# Patient Record
Sex: Female | Born: 1975 | Race: White | Hispanic: No | Marital: Married | Smoking: Never smoker
Health system: Southern US, Community
[De-identification: ages and names within clinical notes are randomized; demographics above are authoritative.]

## PROBLEM LIST (undated history)

## (undated) DIAGNOSIS — E079 Disorder of thyroid, unspecified: Secondary | ICD-10-CM

## (undated) DIAGNOSIS — Z90710 Acquired absence of both cervix and uterus: Secondary | ICD-10-CM

## (undated) DIAGNOSIS — T4145XA Adverse effect of unspecified anesthetic, initial encounter: Secondary | ICD-10-CM

## (undated) DIAGNOSIS — N938 Other specified abnormal uterine and vaginal bleeding: Secondary | ICD-10-CM

## (undated) DIAGNOSIS — T8859XA Other complications of anesthesia, initial encounter: Secondary | ICD-10-CM

## (undated) DIAGNOSIS — N946 Dysmenorrhea, unspecified: Secondary | ICD-10-CM

## (undated) DIAGNOSIS — Z789 Other specified health status: Secondary | ICD-10-CM

## (undated) HISTORY — PX: OTHER SURGICAL HISTORY: SHX169

---

## 1999-01-10 ENCOUNTER — Ambulatory Visit (HOSPITAL_COMMUNITY): Admission: RE | Admit: 1999-01-10 | Discharge: 1999-01-10 | Payer: Self-pay | Admitting: Family Medicine

## 1999-01-10 ENCOUNTER — Encounter: Payer: Self-pay | Admitting: Family Medicine

## 2003-01-17 ENCOUNTER — Other Ambulatory Visit: Admission: RE | Admit: 2003-01-17 | Discharge: 2003-01-17 | Payer: Self-pay | Admitting: Obstetrics & Gynecology

## 2003-06-26 ENCOUNTER — Encounter: Payer: Self-pay | Admitting: Emergency Medicine

## 2003-06-26 ENCOUNTER — Emergency Department (HOSPITAL_COMMUNITY): Admission: EM | Admit: 2003-06-26 | Discharge: 2003-06-26 | Payer: Self-pay | Admitting: Emergency Medicine

## 2004-01-18 ENCOUNTER — Other Ambulatory Visit: Admission: RE | Admit: 2004-01-18 | Discharge: 2004-01-18 | Payer: Self-pay | Admitting: Obstetrics & Gynecology

## 2004-01-26 ENCOUNTER — Ambulatory Visit (HOSPITAL_COMMUNITY): Admission: RE | Admit: 2004-01-26 | Discharge: 2004-01-26 | Payer: Self-pay | Admitting: Obstetrics & Gynecology

## 2004-05-30 ENCOUNTER — Encounter: Admission: RE | Admit: 2004-05-30 | Discharge: 2004-05-30 | Payer: Self-pay | Admitting: Chiropractic Medicine

## 2005-06-04 ENCOUNTER — Other Ambulatory Visit: Admission: RE | Admit: 2005-06-04 | Discharge: 2005-06-04 | Payer: Self-pay | Admitting: Obstetrics and Gynecology

## 2005-12-05 ENCOUNTER — Inpatient Hospital Stay (HOSPITAL_COMMUNITY): Admission: AD | Admit: 2005-12-05 | Discharge: 2005-12-08 | Payer: Self-pay | Admitting: Obstetrics and Gynecology

## 2008-04-06 ENCOUNTER — Ambulatory Visit: Payer: Self-pay | Admitting: Internal Medicine

## 2008-04-06 LAB — CONVERTED CEMR LAB
Basophils Absolute: 0 10*3/uL (ref 0.0–0.1)
Basophils Relative: 0.2 % (ref 0.0–3.0)
Eosinophils Absolute: 0.1 10*3/uL (ref 0.0–0.7)
Eosinophils Relative: 0.6 % (ref 0.0–5.0)
HCT: 31.8 % — ABNORMAL LOW (ref 36.0–46.0)
Hemoglobin: 10.7 g/dL — ABNORMAL LOW (ref 12.0–15.0)
Lymphocytes Relative: 14.4 % (ref 12.0–46.0)
MCHC: 33.6 g/dL (ref 30.0–36.0)
MCV: 80.5 fL (ref 78.0–100.0)
Monocytes Absolute: 0.7 10*3/uL (ref 0.1–1.0)
Monocytes Relative: 6.8 % (ref 3.0–12.0)
Neutro Abs: 8.1 10*3/uL — ABNORMAL HIGH (ref 1.4–7.7)
Neutrophils Relative %: 78 % — ABNORMAL HIGH (ref 43.0–77.0)
Platelets: 166 10*3/uL (ref 150–400)
RBC: 3.95 M/uL (ref 3.87–5.11)
RDW: 15.3 % — ABNORMAL HIGH (ref 11.5–14.6)
TSH: 1.32 microintl units/mL (ref 0.35–5.50)
WBC: 10.4 10*3/uL (ref 4.5–10.5)

## 2008-04-07 ENCOUNTER — Encounter: Payer: Self-pay | Admitting: Internal Medicine

## 2008-04-07 ENCOUNTER — Ambulatory Visit: Payer: Self-pay

## 2008-05-14 ENCOUNTER — Inpatient Hospital Stay (HOSPITAL_COMMUNITY): Admission: AD | Admit: 2008-05-14 | Discharge: 2008-05-14 | Payer: Self-pay | Admitting: Obstetrics and Gynecology

## 2008-05-15 ENCOUNTER — Inpatient Hospital Stay (HOSPITAL_COMMUNITY): Admission: AD | Admit: 2008-05-15 | Discharge: 2008-05-18 | Payer: Self-pay | Admitting: Obstetrics and Gynecology

## 2008-05-22 ENCOUNTER — Inpatient Hospital Stay (HOSPITAL_COMMUNITY): Admission: AD | Admit: 2008-05-22 | Discharge: 2008-05-22 | Payer: Self-pay | Admitting: Obstetrics and Gynecology

## 2009-08-30 ENCOUNTER — Emergency Department (HOSPITAL_BASED_OUTPATIENT_CLINIC_OR_DEPARTMENT_OTHER): Admission: EM | Admit: 2009-08-30 | Discharge: 2009-08-30 | Payer: Self-pay | Admitting: Emergency Medicine

## 2011-02-04 NOTE — Assessment & Plan Note (Signed)
[facility]                            CARDIOLOGY OFFICE NOTE   Kerri Patterson, Kerri Patterson                         MRN:          [id number]  DATE:04/06/2008                            DOB:          1975/10/18    IDENTIFICATION:  The patient is a 35 year old who is referred for  evaluation of shortness of breath and palpitations.   HISTORY OF PRESENT ILLNESS:  The patient is currently in her 33rd week  of pregnancy.  She states for the past 3-1/2 months, she has been short  of breath, which has been progressive.  Today, she is bit short of  breath walking from the waiting room.  She feels her heart racing with  this.  It takes a while to come down after.  She states walking up the  stairs, she gets short of breath.  If she takes a shower and if she gets  into a car, she is short of breath.   The patient is currently in her second pregnancy.  The first pregnancy  was without any problems, normal spontaneous vaginal delivery.  After  this pregnancy, she did fine.  Again, she only noticed these things  about 3-1/2 months ago.   The patient also stated she does feel her heart pounding at times, often  after she has done things.  She has had few episodes of presyncope, not  particularly associated with palpitations.  Her lips have gone little  numb.   She has had a couple of episodes, where her heart has woken her up  because of racing.   She has reflux with this pregnancy.  She also has two-pillow orthopnea.   MEDICATIONS:  Include Protonix and prenatal vitamins.   PAST MEDICAL HISTORY:  G2, P1, and GE reflux.   SOCIAL HISTORY:  The patient is married, used to be an Airline pilot, stays  at home with a 60-year-old, does not smoke, and does not drink.   FAMILY HISTORY:  Maternal grandfather had coronary artery disease.  Father is alive at age 52, has had a CVA, is wheelchair bound, and  cachectic.   REVIEW OF SYSTEMS:  All systems reviewed, negative to the  above problem,  except as noted above.   PHYSICAL EXAMINATION:  GENERAL:  The patient is in no acute distress at  rest.  VITAL SIGNS:  Blood pressure is 142/87, pulse is 97, and weight 246.  HEENT:  Normocephalic and atraumatic.  EOMI.  PERLA.  Mucous membranes  are moist.  NECK:  JVP is normal.  No thyromegaly or bruits.  LUNGS:  Clear to auscultation.  No rales.  Moving air.  CARDIAC:  Regular rate and rhythm.  S1 and S2.  No S3.  ABDOMEN:  Benign and distended.  No hepatomegaly.  EXTREMITIES:  Trivial lower extremity edema.   A 12-lead EKG shows normal sinus rhythm at 97 beats per minute with  normal conduction intervals.   Pulse oximetry, I walked with the patient.  Her sats remained 97-98% on  room air.  Note, her heart rate did pick up with exercise to the 120s.  It seemed to be more of a gradual up and down.  She was symptomatic with  this.  As her pulse slowed into the 90s at rest, she still felt  pounding.   IMPRESSION:  Kerri Patterson is a 35 year old, now in her second pregnancy at  25 weeks.  This pregnancy, she has been troubled by a lot of shortness  of breath.  In deed, she does get quite dyspneic while watching her.  Still, she does not desaturate, her heart rate increases, but not  significantly.   I am not convinced that there is anything pathologic occurring.  Some of  her symptoms maybe due to her size in the setting of her current  developing pregnancy.   I have recommended today that we get a CBC and TSH.  I have also set the  patient up for an echo.  Take activities as tolerated.  I will be in  touch with her once I have seen the test results.   In regards to her heart racing, on walking with her, I am impressed that  it is a smooth increase that seems to be consistent with sinus  tachycardia.  I am not convinced that there is any other arrhythmia  happening.  Again, I will be in touch with her once I have seen the echo  results.     Pricilla Riffle, MD,  [facility]  Electronically Signed    PVR/MedQ  DD: 04/06/2008  DT: 04/07/2008  Job #: [id number]   cc:   Tally Joe, M.D.

## 2011-02-04 NOTE — Discharge Summary (Signed)
Kerri Patterson, Kerri Patterson                ACCOUNT NO.:  [id number]   MEDICAL RECORD NO.:  [id number]          PATIENT TYPE:  INP   LOCATION:  9119                          FACILITY:  [facility]   PHYSICIAN:  Sherron Monday, MD        DATE OF BIRTH:  04-09-1976   DATE OF ADMISSION:  05/15/2008  DATE OF DISCHARGE:  05/18/2008                               DISCHARGE SUMMARY   ADMITTING DIAGNOSIS:  Intrauterine pregnancy at term, in early labor.   DISCHARGE DIAGNOSIS:  Intrauterine pregnancy at term, in early labor,  delivered via spontaneous vaginal delivery.   HISTORY OF PRESENT ILLNESS:  A 35 year old G2, P1-0-0-1 at 62 and 5  weeks with contractions every 3 minutes increasing in intensity and  frequency this a.m. since approximately 4 a.m.  She has some back pain,  nausea, and vomiting that began at noon and has increased.  Good fetal  movement, no loss of fluid, and no vaginal bleeding.  In her pregnancy,  she was seen by Cardiology secondary to palpitations and shortness of  breath.  They discovered decreased hemoglobin and recovered iron.  Otherwise, uncomplicated prenatal care.  Cervical change since last seen  at the office.   PAST MEDICAL HISTORY:  Significant for history of panic attack and  migraines.   PAST SURGICAL HISTORY:  Significant for laparoscopy.   PAST OBSTETRICAL/GYNECOLOGICAL HISTORY:  G1, term vaginal delivery, 8-  pound 1-ounce infant.  G2 is her present pregnancy.  No abnormal Pap  smears or sexual transmitted diseases.   MEDICATIONS:  Prenatal vitamins and Protonix.   ALLERGIES:  No known drug allergies.   SOCIAL HISTORY:  She denies alcohol, tobacco, or drug use.  She is  married.   FAMILY HISTORY:  Significant for coronary artery disease in maternal  grandfather, hypertension in maternal grandfather and father, sister  with epilepsy, sister with bipolar, lung cancer in maternal grandfather,  and diabetes in maternal grandfather.   PRENATAL LABORATORIES:   Hemoglobin 12.3, platelets 252,000, O+, antibody  screen negative.  Urinalysis negative.  Gonorrhea negative. Chlamydia  negative.  RPR nonreactive.  Rubella immune.  Hepatitis C surface  antigen negative.  HIV negative.  AFP within normal limits.  First-  trimester screen within normal limits.  Glucola 104.  Group B strep was  negative.   ULTRASOUND:  A first-trimester screen noted normal nuchal thickness.  On  scan at 18 plus 6 weeks, giving an [facility] of May 24, 2008, consistent  with her LMP; normal anatomy, anterior placenta, and a female infant.   PHYSICAL EXAMINATION:  Afebrile. Vital signs stable on admission with a  benign exam.  Fetal heart rate tones in the 130s and reactive with  irregular contractions.  Vaginal exam:  4 cm dilated, 50%, and -3  station.   HOSPITAL COURSE:  Pitocin was started to better apply the infant to help  with the rupture of membranes.  Her membranes were ruptured that evening  for clear fluid.  She continued to have progress and progressed to  complete plus 2 with 120s to 130s fetal heart tones and good  variability  and occasional variables.  She pushed well for approximately 10 minutes  and delivered a viable female infant from OA to LOT with Apgars of 8 at  one minute and 9 at five minutes and weight of 8 pounds 2 ounces at 4:07  a.m.  Placenta was expressed intact at 4:12 a.m.  OB laceration partial  third degree, repaired with 2-0 and 3-0 Vicryl, supporting stitches to  the sphincter were given with 2-0 Vicryl.  Second-degree repair with 3-0  Vicryl in the typical fashion.  EBL less than 500 mL.  Her postpartum  course was relatively uncomplicated.  She remained afebrile, had some  soreness that was controlled with medicine.  She was ambulating well.  Her hemoglobin decreased from 11.9 to 9.5.  She was discharged to home  on postpartum day #2, ambulating well, breast-feeding well, tolerating a  diet, and voicing readiness to go home.  She was given  routine discharge  instructions and numbers to call with any questions or problems as well  as prescriptions for Motrin, Vicodin, and prenatal vitamins.   DISCHARGE INFORMATION:  She is breast-feeding.  She is O+.  She will not  use contraceptive.  Rubella immune.  Hemoglobin 11.9 to 9.5.      Sherron Monday, MD  Electronically Signed     JB/MEDQ  D:  05/18/2008  T:  05/18/2008  Job:  [id number]

## 2011-02-07 NOTE — Discharge Summary (Signed)
NAMEGENNA, Kerri Patterson                ACCOUNT NO.:  [id number]   MEDICAL RECORD NO.:  [id number]          PATIENT TYPE:  INP   LOCATION:  9106                          [facility]ACILITY:  [facility]   PHYSICIAN:  Zenaida Niece, M.D.DATE OF BIRTH:  1976/09/15   DATE OF ADMISSION:  12/05/2005  DATE OF DISCHARGE:  12/07/2005                                 DISCHARGE SUMMARY   ADMISSION DIAGNOSIS:  Intrauterine pregnancy at 41 weeks.   DISCHARGE DIAGNOSIS:  Intrauterine pregnancy at 41 weeks.   PROCEDURES:  On March17 she had a forceps assisted vaginal delivery.   HISTORY AND PHYSICAL:  This is a 35 year old white female gravida 1, para 0  with an EGA of [redacted] weeks by in vitro fertilization who presents for  induction. Prenatal care complicated by the fact that she did conceive by in  vitro fertilization and had a vanishing twin. She had gastroesophageal  reflux controlled with Protonix and occasional decreased fetal movement with  reactive NSTs.   PRENATAL LABS:  Blood type is O+ with a negative antibody screen, RPR  nonreactive, rubella equivocal, hepatitis B surface antigen negative, HIV  negative, gonorrhea and chlamydia negative, 1-hour Glucola 82, group B strep  is negative.   PAST MEDICAL HISTORY:  Migraine headaches, panic attacks and lumbar disk  problems.   PAST SURGICAL HISTORY:  Laparoscopy in 2002.   MEDICATIONS:  Protonix daily.   PHYSICAL EXAM:  VITAL SIGNS:  She is afebrile with stable vital signs. Fetal  heart tracing is reactive with rare contractions. Abdomen is gravid,  nontender with an estimated fetal weight of 8 pounds. Cervix is closed, 30,  -3, vertex presentation and adequate pelvis.   HOSPITAL COURSE:  The patient was admitted on the morning of March16 and had  Cytotec placed for cervical ripening. Throughout the day she received three  doses of Cytotec without much success. At approximately 10:00 p.m. on  March16 she had a Cervidil placed. Then at approximately 0030  on March18 she  had spontaneous rupture of membranes and the Cervidil was expelled. She was  then started on Pitocin. She  did then progress into labor, reached  complete, and pushed well. She pushed for approximately 2 hours after which  she had a forceps assisted vaginal delivery due to maternal exhaustion. She  delivered a viable female infant with Apgars of 9 and 9, weight 8 pounds 1  ounce. There was noted to be meconium once forceps were placed and DeLee and  bulb suction were done on the perineum. The loose nuchal cord x1 was  reduced. She had a manual removal of the placenta due to the fact that the  cord separated from placenta. She then had a partial third-degree laceration  and left vaginal sulcus tear repaired with 2-0 with 3-0 Vicryl and the  rectum was intact. Estimated blood loss was approximately 600 mL. Postpartum  she had no significant complications. Pre delivery hemoglobin was 11,  postdelivery was 10.9. On postpartum #2 she was felt to be stable enough for  discharge home.   DISCHARGE INSTRUCTIONS:  Regular diet, pelvic rest. Follow-up  in 6 weeks.   DISCHARGE MEDICATIONS:  1.  Over-the-counter ibuprofen.  2.  Percocet #30 one to two p.o. q.4-6 hours p.r.n. pain.   She is given our discharge pamphlet.      Zenaida Niece, M.D.  Electronically Signed     TDM/MEDQ  D:  12/08/2005  T:  12/09/2005  Job:  [id number]

## 2012-11-01 ENCOUNTER — Ambulatory Visit: Payer: BC Managed Care – PPO | Attending: Neurosurgery | Admitting: Physical Therapy

## 2012-11-01 DIAGNOSIS — M545 Low back pain, unspecified: Secondary | ICD-10-CM | POA: Insufficient documentation

## 2012-11-01 DIAGNOSIS — M2569 Stiffness of other specified joint, not elsewhere classified: Secondary | ICD-10-CM | POA: Insufficient documentation

## 2012-11-01 DIAGNOSIS — IMO0001 Reserved for inherently not codable concepts without codable children: Secondary | ICD-10-CM | POA: Insufficient documentation

## 2012-11-04 ENCOUNTER — Ambulatory Visit: Payer: BC Managed Care – PPO | Admitting: Physical Therapy

## 2012-11-05 ENCOUNTER — Encounter: Payer: BC Managed Care – PPO | Admitting: Physical Therapy

## 2012-11-09 ENCOUNTER — Ambulatory Visit: Payer: BC Managed Care – PPO | Admitting: Physical Therapy

## 2012-11-11 ENCOUNTER — Ambulatory Visit: Payer: BC Managed Care – PPO | Admitting: Physical Therapy

## 2012-11-15 ENCOUNTER — Ambulatory Visit: Payer: BC Managed Care – PPO | Admitting: Physical Therapy

## 2012-11-18 ENCOUNTER — Ambulatory Visit: Payer: BC Managed Care – PPO | Admitting: Physical Therapy

## 2013-11-14 ENCOUNTER — Encounter (HOSPITAL_COMMUNITY): Payer: Self-pay | Admitting: Pharmacist

## 2013-11-24 ENCOUNTER — Encounter (HOSPITAL_COMMUNITY)
Admission: RE | Admit: 2013-11-24 | Discharge: 2013-11-24 | Disposition: A | Discharge status: Home / Self Care | Payer: BC Managed Care – PPO | Source: Ambulatory Visit | Attending: Obstetrics and Gynecology | Admitting: Obstetrics and Gynecology

## 2013-11-24 ENCOUNTER — Encounter (HOSPITAL_COMMUNITY): Payer: Self-pay

## 2013-11-24 DIAGNOSIS — Z01812 Encounter for preprocedural laboratory examination: Secondary | ICD-10-CM | POA: Insufficient documentation

## 2013-11-24 HISTORY — DX: Adverse effect of unspecified anesthetic, initial encounter: T41.45XA

## 2013-11-24 HISTORY — DX: Other complications of anesthesia, initial encounter: T88.59XA

## 2013-11-24 HISTORY — DX: Other specified health status: Z78.9

## 2013-11-24 LAB — CBC
HCT: 33.4 % — ABNORMAL LOW (ref 36.0–46.0)
Hemoglobin: 9.8 g/dL — ABNORMAL LOW (ref 12.0–15.0)
MCH: 19.3 pg — ABNORMAL LOW (ref 26.0–34.0)
MCHC: 29.3 g/dL — ABNORMAL LOW (ref 30.0–36.0)
MCV: 65.9 fL — ABNORMAL LOW (ref 78.0–100.0)
PLATELETS: 298 10*3/uL (ref 150–400)
RBC: 5.07 MIL/uL (ref 3.87–5.11)
RDW: 20.5 % — AB (ref 11.5–15.5)
WBC: 7.8 10*3/uL (ref 4.0–10.5)

## 2013-11-24 LAB — BASIC METABOLIC PANEL
BUN: 9 mg/dL (ref 6–23)
CHLORIDE: 103 meq/L (ref 96–112)
CO2: 27 meq/L (ref 19–32)
Calcium: 10.3 mg/dL (ref 8.4–10.5)
Creatinine, Ser: 0.76 mg/dL (ref 0.50–1.10)
GFR calc Af Amer: >90 mL/min (ref >90)
GFR calc non Af Amer: >90 mL/min (ref >90)
Glucose, Bld: 93 mg/dL (ref 70–99)
POTASSIUM: 4.1 meq/L (ref 3.7–5.3)
SODIUM: 142 meq/L (ref 137–147)

## 2013-11-24 NOTE — Patient Instructions (Signed)
[address]  11/24/2013   Your procedure is scheduled on:  11/29/13  [facility] through [facility] Entrance [facility] at 6 AM.  Pick up the phone at the desk and dial [phone].   Call this number if you have problems the morning of surgery: [phone]   Remember:   Do not eat food:After Midnight.  Do not drink clear liquids: After Midnight.  Take these medicines the morning of surgery with A SIP OF WATER: NA   Do not wear jewelry, make-up or nail polish.  Do not wear lotions, powders, or perfumes. You may wear deodorant.  Do not shave 24 hours prior to surgery.  Do not bring valuables to the hospital.  [facility] is not   responsible for any belongings or valuables brought to the hospital.  Contacts, dentures or bridgework may not be worn into surgery.  Leave suitcase in the car. After surgery it may be brought to your room.  For patients admitted to the hospital, checkout time is 11:00 AM the day of              discharge.   Patients discharged the day of surgery will not be allowed to drive             home.  Name and phone number of your driver: NA  Special Instructions:     Please read over the following fact sheets that you were given:   Surgical Site Infection Prevention

## 2013-11-28 ENCOUNTER — Encounter (HOSPITAL_COMMUNITY): Payer: Self-pay | Admitting: Anesthesiology

## 2013-11-28 DIAGNOSIS — N946 Dysmenorrhea, unspecified: Secondary | ICD-10-CM

## 2013-11-28 DIAGNOSIS — N938 Other specified abnormal uterine and vaginal bleeding: Secondary | ICD-10-CM

## 2013-11-28 HISTORY — DX: Dysmenorrhea, unspecified: N94.6

## 2013-11-28 HISTORY — DX: Other specified abnormal uterine and vaginal bleeding: N93.8

## 2013-11-28 NOTE — H&P (Signed)
Kerri Patterson is an 38 y.o. female G2P2002 with BUB, AUB and dysmenorrhea.  Likely Adenomyosis on US.  Also benign endometrial biopsy.  Has menses Q2-4wk, heavy with clots.  Was managed for a while with OCP.  Pt states now ready for definitive management.  D/W pt r/b/a of LAVH with B salpingectomy.  Q's answered.    Pertinent Gynecological History: Menses: with severe dysmenorrhea and heavy, clotty; q 2-4 wks Bleeding: dysfunctional uterine bleeding Contraception: none DES exposure: unknown Blood transfusions: none Sexually transmitted diseases: no past history Previous GYN Procedures: none  Last pap: normal Date: 10/13 OB History: G2, P2002 3/07 SVD female 8#1, 8/09 SVD female 8#2 Benign EMB H/o subfertility and IVF Adenomyosis on US occ menopausal sx's - hot flashes, night sweats  Menstrual History:  No LMP recorded.    Past Medical History  Diagnosis Date  . Complication of anesthesia     Epidural "went up to chest instead of down"  . Medical history non-contributory   . Dysfunctional uterine bleeding 11/28/2013  . Dysmenorrhea 11/28/2013    Past Surgical History  Procedure Laterality Date  . Invitro fertilization     FH: bipolar, epilepsy, CAD, DM, HTN, lung CA  Social History:  reports that she has never smoked. She does not have any smokeless tobacco history on file. She reports that she drinks alcohol. She reports that she does not use illicit drugs.married, homemaker  Allergies: No Known Allergies  No prescriptions prior to admission  Meds: occ percocet, probiotic, vitamin D, omega 3 - ([facility] [facility] vitamin pack)  Review of Systems  Constitutional: Negative.   HENT: Negative.   Eyes: Negative.   Respiratory: Negative.   Cardiovascular: Negative.   Gastrointestinal: Negative.   Genitourinary: Negative.   Musculoskeletal: Negative.   Skin: Negative.   Neurological: Negative.   Psychiatric/Behavioral: Negative.     There were no vitals taken for this  visit. Physical Exam  Constitutional: She is oriented to person, place, and time. She appears well-developed and well-nourished.  Morbidly obese  HENT:  Head: Normocephalic and atraumatic.  Cardiovascular: Normal rate and regular rhythm.   Respiratory: Effort normal and breath sounds normal. No respiratory distress. She has no wheezes.  GI: Soft. Bowel sounds are normal. She exhibits no distension. There is no tenderness.  Musculoskeletal: Normal range of motion.  Neurological: She is alert and oriented to person, place, and time.  Skin: Skin is warm and dry.  Psychiatric: She has a normal mood and affect. Her behavior is normal.   US adenomyosis  Assessment/Plan: 37yo G2P2 for LAVH/B salpingectomy secondary to dysmenorrhea, DUB D/w pt r/b/a of surgery wishes to proceed Has had medical clearance  BOVARD,Mourad Cwikla 11/28/2013, 9:02 PM

## 2013-11-28 NOTE — Anesthesia Preprocedure Evaluation (Addendum)
Anesthesia Evaluation  Patient identified by MRN, date of birth, ID band Patient awake    Reviewed: Allergy & Precautions, H&P , NPO status , Patient's Chart, lab work & pertinent test results  History of Anesthesia Complications (+) history of anesthetic complications  Airway Mallampati: I TM Distance: >3 FB Neck ROM: Full    Dental no notable dental hx.    Pulmonary neg pulmonary ROS,  breath sounds clear to auscultation  Pulmonary exam normal       Cardiovascular negative cardio ROS  Rhythm:Regular Rate:Normal     Neuro/Psych negative neurological ROS  negative psych ROS   GI/Hepatic negative GI ROS, Neg liver ROS,   Endo/Other  Morbid obesity  Renal/GU negative Renal ROS  negative genitourinary   Musculoskeletal   Abdominal Normal abdominal exam  (+) + obese,   Peds  Hematology  (+) anemia ,   Anesthesia Other Findings   Reproductive/Obstetrics Adenomyosis                         Anesthesia Physical Anesthesia Plan  ASA: III  Anesthesia Plan: General   Post-op Pain Management:    Induction: Intravenous  Airway Management Planned: Oral ETT  Additional Equipment:   Intra-op Plan:   Post-operative Plan: Extubation in OR  Informed Consent: I have reviewed the patients History and Physical, chart, labs and discussed the procedure including the risks, benefits and alternatives for the proposed anesthesia with the patient or authorized representative who has indicated his/her understanding and acceptance.   Dental advisory given  Plan Discussed with: CRNA, Anesthesiologist and Surgeon  Anesthesia Plan Comments:         Anesthesia Quick Evaluation

## 2013-11-29 ENCOUNTER — Encounter (HOSPITAL_COMMUNITY)
Admission: RE | Disposition: A | Discharge status: Home / Self Care | Payer: Self-pay | Source: Ambulatory Visit | Attending: Obstetrics and Gynecology

## 2013-11-29 ENCOUNTER — Encounter (HOSPITAL_COMMUNITY): Payer: Self-pay | Admitting: *Deleted

## 2013-11-29 ENCOUNTER — Ambulatory Visit (HOSPITAL_COMMUNITY): Payer: BC Managed Care – PPO | Admitting: Anesthesiology

## 2013-11-29 ENCOUNTER — Observation Stay (HOSPITAL_COMMUNITY)
Admission: RE | Admit: 2013-11-29 | Discharge: 2013-11-30 | Disposition: A | Discharge status: Home / Self Care | Payer: BC Managed Care – PPO | Source: Ambulatory Visit | Attending: Obstetrics and Gynecology | Admitting: Obstetrics and Gynecology

## 2013-11-29 ENCOUNTER — Encounter (HOSPITAL_COMMUNITY): Payer: BC Managed Care – PPO | Admitting: Anesthesiology

## 2013-11-29 DIAGNOSIS — N938 Other specified abnormal uterine and vaginal bleeding: Principal | ICD-10-CM | POA: Diagnosis present

## 2013-11-29 DIAGNOSIS — N949 Unspecified condition associated with female genital organs and menstrual cycle: Secondary | ICD-10-CM | POA: Insufficient documentation

## 2013-11-29 DIAGNOSIS — N946 Dysmenorrhea, unspecified: Secondary | ICD-10-CM | POA: Diagnosis present

## 2013-11-29 DIAGNOSIS — Z90710 Acquired absence of both cervix and uterus: Secondary | ICD-10-CM

## 2013-11-29 DIAGNOSIS — N8000 Endometriosis of the uterus, unspecified: Secondary | ICD-10-CM | POA: Insufficient documentation

## 2013-11-29 DIAGNOSIS — N925 Other specified irregular menstruation: Secondary | ICD-10-CM | POA: Insufficient documentation

## 2013-11-29 DIAGNOSIS — N838 Other noninflammatory disorders of ovary, fallopian tube and broad ligament: Secondary | ICD-10-CM | POA: Insufficient documentation

## 2013-11-29 DIAGNOSIS — D649 Anemia, unspecified: Secondary | ICD-10-CM | POA: Insufficient documentation

## 2013-11-29 HISTORY — DX: Acquired absence of both cervix and uterus: Z90.710

## 2013-11-29 HISTORY — DX: Other specified abnormal uterine and vaginal bleeding: N93.8

## 2013-11-29 HISTORY — DX: Dysmenorrhea, unspecified: N94.6

## 2013-11-29 HISTORY — PX: LAPAROSCOPIC ASSISTED VAGINAL HYSTERECTOMY: SHX5398

## 2013-11-29 HISTORY — PX: BILATERAL SALPINGECTOMY: SHX5743

## 2013-11-29 LAB — PREGNANCY, URINE: Preg Test, Ur: NEGATIVE

## 2013-11-29 SURGERY — HYSTERECTOMY, VAGINAL, LAPAROSCOPY-ASSISTED
Anesthesia: General | Site: Abdomen

## 2013-11-29 MED ORDER — ACETAMINOPHEN 160 MG/5ML PO SOLN
ORAL | Status: AC
  Filled 2013-11-29: qty 40.6

## 2013-11-29 MED ORDER — OXYCODONE-ACETAMINOPHEN 5-325 MG PO TABS
1.0000 | ORAL_TABLET | ORAL | Status: DC | PRN
  Administered 2013-11-30: 2 via ORAL
  Administered 2013-11-30 (×2): 1 via ORAL
  Filled 2013-11-29: qty 2
  Filled 2013-11-29 (×2): qty 1

## 2013-11-29 MED ORDER — HYDROMORPHONE 0.3 MG/ML IV SOLN
INTRAVENOUS | Status: DC
  Administered 2013-11-29: 13:00:00 via INTRAVENOUS
  Administered 2013-11-29: 16.67 mL via INTRAVENOUS
  Administered 2013-11-29: 4.79 mg via INTRAVENOUS
  Administered 2013-11-30: 0.599 mg via INTRAVENOUS
  Filled 2013-11-29: qty 25

## 2013-11-29 MED ORDER — LIDOCAINE HCL (CARDIAC) 20 MG/ML IV SOLN
INTRAVENOUS | Status: DC | PRN
  Administered 2013-11-29: 100 mg via INTRAVENOUS

## 2013-11-29 MED ORDER — ONDANSETRON HCL 4 MG/2ML IJ SOLN
INTRAMUSCULAR | Status: AC
  Filled 2013-11-29: qty 2

## 2013-11-29 MED ORDER — HEPARIN SODIUM (PORCINE) 5000 UNIT/ML IJ SOLN
INTRAMUSCULAR | Status: AC
  Filled 2013-11-29: qty 1

## 2013-11-29 MED ORDER — VASOPRESSIN 20 UNIT/ML IJ SOLN
INTRAMUSCULAR | Status: AC
  Filled 2013-11-29: qty 1

## 2013-11-29 MED ORDER — MIDAZOLAM HCL 2 MG/2ML IJ SOLN
INTRAMUSCULAR | Status: AC
  Filled 2013-11-29: qty 2

## 2013-11-29 MED ORDER — SCOPOLAMINE 1 MG/3DAYS TD PT72
MEDICATED_PATCH | TRANSDERMAL | Status: AC
  Filled 2013-11-29: qty 1

## 2013-11-29 MED ORDER — SCOPOLAMINE 1 MG/3DAYS TD PT72
1.0000 | MEDICATED_PATCH | TRANSDERMAL | Status: DC
  Administered 2013-11-29: 1.5 mg via TRANSDERMAL

## 2013-11-29 MED ORDER — PROPOFOL 10 MG/ML IV EMUL
INTRAVENOUS | Status: AC
  Filled 2013-11-29: qty 20

## 2013-11-29 MED ORDER — KETOROLAC TROMETHAMINE 30 MG/ML IJ SOLN
15.0000 mg | Freq: Once | INTRAMUSCULAR | Status: AC | PRN
  Administered 2013-11-29: 30 mg via INTRAVENOUS

## 2013-11-29 MED ORDER — MEPERIDINE HCL 25 MG/ML IJ SOLN: 6.2500 mg | INTRAMUSCULAR | Status: DC | PRN

## 2013-11-29 MED ORDER — MENTHOL 3 MG MT LOZG: 1.0000 | LOZENGE | OROMUCOSAL | Status: DC | PRN

## 2013-11-29 MED ORDER — DEXAMETHASONE SODIUM PHOSPHATE 10 MG/ML IJ SOLN
INTRAMUSCULAR | Status: AC
  Filled 2013-11-29: qty 1

## 2013-11-29 MED ORDER — SODIUM CHLORIDE 0.9 % IJ SOLN
INTRAMUSCULAR | Status: AC
  Filled 2013-11-29: qty 50

## 2013-11-29 MED ORDER — LACTATED RINGERS IV SOLN: INTRAVENOUS | Status: DC

## 2013-11-29 MED ORDER — PROPOFOL 10 MG/ML IV BOLUS
INTRAVENOUS | Status: DC | PRN
  Administered 2013-11-29: 200 mg via INTRAVENOUS

## 2013-11-29 MED ORDER — NEOSTIGMINE METHYLSULFATE 1 MG/ML IJ SOLN
INTRAMUSCULAR | Status: DC | PRN
  Administered 2013-11-29: 4 mg via INTRAVENOUS

## 2013-11-29 MED ORDER — SIMETHICONE 80 MG PO CHEW
80.0000 mg | CHEWABLE_TABLET | Freq: Four times a day (QID) | ORAL | Status: DC | PRN
  Administered 2013-11-30 (×2): 80 mg via ORAL
  Filled 2013-11-29 (×2): qty 1

## 2013-11-29 MED ORDER — ONDANSETRON HCL 4 MG/2ML IJ SOLN
4.0000 mg | Freq: Four times a day (QID) | INTRAMUSCULAR | Status: DC | PRN
  Administered 2013-11-29 – 2013-11-30 (×2): 4 mg via INTRAVENOUS

## 2013-11-29 MED ORDER — DIPHENHYDRAMINE HCL 50 MG/ML IJ SOLN: 12.5000 mg | Freq: Four times a day (QID) | INTRAMUSCULAR | Status: DC | PRN

## 2013-11-29 MED ORDER — KETOROLAC TROMETHAMINE 30 MG/ML IJ SOLN
INTRAMUSCULAR | Status: AC
  Administered 2013-11-29: 30 mg via INTRAVENOUS
  Filled 2013-11-29: qty 1

## 2013-11-29 MED ORDER — GUAIFENESIN 100 MG/5ML PO SOLN: 15.0000 mL | ORAL | Status: DC | PRN

## 2013-11-29 MED ORDER — CEFAZOLIN SODIUM-DEXTROSE 2-3 GM-% IV SOLR
INTRAVENOUS | Status: AC
  Filled 2013-11-29: qty 50

## 2013-11-29 MED ORDER — ALUM & MAG HYDROXIDE-SIMETH 200-200-20 MG/5ML PO SUSP: 30.0000 mL | ORAL | Status: DC | PRN

## 2013-11-29 MED ORDER — SODIUM CHLORIDE 0.9 % IJ SOLN: 9.0000 mL | INTRAMUSCULAR | Status: DC | PRN

## 2013-11-29 MED ORDER — LIDOCAINE HCL (CARDIAC) 20 MG/ML IV SOLN
INTRAVENOUS | Status: AC
  Filled 2013-11-29: qty 5

## 2013-11-29 MED ORDER — METHYLENE BLUE 1 % INJ SOLN
INTRAMUSCULAR | Status: AC
  Filled 2013-11-29: qty 10

## 2013-11-29 MED ORDER — METOCLOPRAMIDE HCL 5 MG/ML IJ SOLN: 10.0000 mg | Freq: Once | INTRAMUSCULAR | Status: DC | PRN

## 2013-11-29 MED ORDER — ONDANSETRON HCL 4 MG/2ML IJ SOLN
4.0000 mg | Freq: Four times a day (QID) | INTRAMUSCULAR | Status: DC | PRN
  Filled 2013-11-29 (×2): qty 2

## 2013-11-29 MED ORDER — GLYCOPYRROLATE 0.2 MG/ML IJ SOLN
INTRAMUSCULAR | Status: AC
  Filled 2013-11-29: qty 3

## 2013-11-29 MED ORDER — GLYCOPYRROLATE 0.2 MG/ML IJ SOLN
INTRAMUSCULAR | Status: DC | PRN
  Administered 2013-11-29: 0.6 mg via INTRAVENOUS

## 2013-11-29 MED ORDER — ROCURONIUM BROMIDE 100 MG/10ML IV SOLN
INTRAVENOUS | Status: AC
  Filled 2013-11-29: qty 1

## 2013-11-29 MED ORDER — CEFAZOLIN SODIUM-DEXTROSE 2-3 GM-% IV SOLR
2.0000 g | INTRAVENOUS | Status: AC
  Administered 2013-11-29: 2 g via INTRAVENOUS

## 2013-11-29 MED ORDER — DIPHENHYDRAMINE HCL 12.5 MG/5ML PO ELIX: 12.5000 mg | ORAL_SOLUTION | Freq: Four times a day (QID) | ORAL | Status: DC | PRN

## 2013-11-29 MED ORDER — HYDROMORPHONE HCL PF 1 MG/ML IJ SOLN
INTRAMUSCULAR | Status: AC
  Administered 2013-11-29: 0.5 mg via INTRAVENOUS
  Filled 2013-11-29: qty 1

## 2013-11-29 MED ORDER — ACETAMINOPHEN 160 MG/5ML PO SOLN
975.0000 mg | Freq: Once | ORAL | Status: AC
  Administered 2013-11-29: 975 mg via ORAL

## 2013-11-29 MED ORDER — NEOSTIGMINE METHYLSULFATE 1 MG/ML IJ SOLN
INTRAMUSCULAR | Status: AC
  Filled 2013-11-29: qty 1

## 2013-11-29 MED ORDER — DEXAMETHASONE SODIUM PHOSPHATE 10 MG/ML IJ SOLN
INTRAMUSCULAR | Status: DC | PRN
  Administered 2013-11-29: 10 mg via INTRAVENOUS

## 2013-11-29 MED ORDER — BUPIVACAINE HCL (PF) 0.25 % IJ SOLN
INTRAMUSCULAR | Status: AC
Start: 2013-11-29 — End: 2013-11-29
  Filled 2013-11-29: qty 30

## 2013-11-29 MED ORDER — ONDANSETRON HCL 4 MG PO TABS: 4.0000 mg | ORAL_TABLET | Freq: Four times a day (QID) | ORAL | Status: DC | PRN

## 2013-11-29 MED ORDER — LACTATED RINGERS IV SOLN
INTRAVENOUS | Status: DC
  Administered 2013-11-29: 1000 mL via INTRAVENOUS
  Administered 2013-11-29 (×2): via INTRAVENOUS

## 2013-11-29 MED ORDER — MIDAZOLAM HCL 2 MG/2ML IJ SOLN
INTRAMUSCULAR | Status: DC | PRN
  Administered 2013-11-29: 2 mg via INTRAVENOUS

## 2013-11-29 MED ORDER — ROCURONIUM BROMIDE 100 MG/10ML IV SOLN
INTRAVENOUS | Status: DC | PRN
  Administered 2013-11-29: 5 mg via INTRAVENOUS
  Administered 2013-11-29: 50 mg via INTRAVENOUS
  Administered 2013-11-29 (×2): 20 mg via INTRAVENOUS

## 2013-11-29 MED ORDER — FENTANYL CITRATE 0.05 MG/ML IJ SOLN
INTRAMUSCULAR | Status: AC
  Filled 2013-11-29: qty 2

## 2013-11-29 MED ORDER — HYDROMORPHONE HCL PF 1 MG/ML IJ SOLN
0.2500 mg | INTRAMUSCULAR | Status: DC | PRN
  Administered 2013-11-29 (×4): 0.5 mg via INTRAVENOUS

## 2013-11-29 MED ORDER — ONDANSETRON HCL 4 MG/2ML IJ SOLN
INTRAMUSCULAR | Status: DC | PRN
  Administered 2013-11-29: 4 mg via INTRAVENOUS

## 2013-11-29 MED ORDER — FENTANYL CITRATE 0.05 MG/ML IJ SOLN
INTRAMUSCULAR | Status: DC | PRN
  Administered 2013-11-29: 50 ug via INTRAVENOUS
  Administered 2013-11-29 (×2): 100 ug via INTRAVENOUS
  Administered 2013-11-29: 50 ug via INTRAVENOUS
  Administered 2013-11-29: 100 ug via INTRAVENOUS
  Administered 2013-11-29: 50 ug via INTRAVENOUS

## 2013-11-29 MED ORDER — VASOPRESSIN 20 UNIT/ML IJ SOLN
INTRAVENOUS | Status: DC | PRN
  Administered 2013-11-29: 08:00:00 via INTRAMUSCULAR

## 2013-11-29 MED ORDER — BUPIVACAINE HCL (PF) 0.25 % IJ SOLN
INTRAMUSCULAR | Status: DC | PRN
  Administered 2013-11-29: 10 mL

## 2013-11-29 MED ORDER — IBUPROFEN 800 MG PO TABS: 800.0000 mg | ORAL_TABLET | Freq: Three times a day (TID) | ORAL | Status: DC | PRN

## 2013-11-29 MED ORDER — LACTATED RINGERS IV SOLN
INTRAVENOUS | Status: DC
  Administered 2013-11-29 (×2): via INTRAVENOUS

## 2013-11-29 MED ORDER — NALOXONE HCL 0.4 MG/ML IJ SOLN: 0.4000 mg | INTRAMUSCULAR | Status: DC | PRN

## 2013-11-29 MED ORDER — FENTANYL CITRATE 0.05 MG/ML IJ SOLN
INTRAMUSCULAR | Status: AC
  Filled 2013-11-29: qty 5

## 2013-11-29 SURGICAL SUPPLY — 37 items
ADH SKN CLS APL DERMABOND .7 (GAUZE/BANDAGES/DRESSINGS) ×4
CABLE HIGH FREQUENCY MONO STRZ (ELECTRODE) IMPLANT
CHLORAPREP W/TINT 26ML (MISCELLANEOUS) ×4 IMPLANT
CLOSURE WOUND 1/4 X3 (GAUZE/BANDAGES/DRESSINGS)
CLOTH BEACON ORANGE TIMEOUT ST (SAFETY) ×4 IMPLANT
CONT PATH 16OZ SNAP LID 3702 (MISCELLANEOUS) ×4 IMPLANT
COVER TABLE BACK 60X90 (DRAPES) ×4 IMPLANT
DECANTER SPIKE VIAL GLASS SM (MISCELLANEOUS) IMPLANT
DERMABOND ADVANCED (GAUZE/BANDAGES/DRESSINGS) ×4
DERMABOND ADVANCED .7 DNX12 (GAUZE/BANDAGES/DRESSINGS) IMPLANT
ELECT REM PT RETURN 9FT ADLT (ELECTROSURGICAL) ×4
ELECTRODE REM PT RTRN 9FT ADLT (ELECTROSURGICAL) IMPLANT
EVACUATOR PREFILTER SMOKE (MISCELLANEOUS) ×4 IMPLANT
GLOVE BIO SURGEON STRL SZ 6.5 (GLOVE) ×6 IMPLANT
GLOVE BIO SURGEON STRL SZ7 (GLOVE) ×4 IMPLANT
GLOVE BIO SURGEONS STRL SZ 6.5 (GLOVE) ×4
GOWN STRL REUS W/TWL LRG LVL3 (GOWN DISPOSABLE) ×12 IMPLANT
GOWN STRL REUS W/TWL XL LVL3 (GOWN DISPOSABLE) ×4 IMPLANT
NEEDLE INSUFFLATION 120MM (ENDOMECHANICALS) ×4 IMPLANT
NS IRRIG 1000ML POUR BTL (IV SOLUTION) ×4 IMPLANT
PACK LAVH (CUSTOM PROCEDURE TRAY) ×4 IMPLANT
PROTECTOR NERVE ULNAR (MISCELLANEOUS) ×4 IMPLANT
SCALPEL HARMONIC ACE (MISCELLANEOUS) ×4 IMPLANT
SET CYSTO W/LG BORE CLAMP LF (SET/KITS/TRAYS/PACK) IMPLANT
SET IRRIG TUBING LAPAROSCOPIC (IRRIGATION / IRRIGATOR) IMPLANT
STRIP CLOSURE SKIN 1/4X3 (GAUZE/BANDAGES/DRESSINGS) IMPLANT
SUT VIC AB 1 CT1 18XBRD ANBCTR (SUTURE) ×4 IMPLANT
SUT VIC AB 1 CT1 8-18 (SUTURE) ×8
SUT VIC AB 2-0 CT1 (SUTURE) ×4 IMPLANT
SUT VICRYL 0 TIES 12 18 (SUTURE) ×4 IMPLANT
SUT VICRYL 0 UR6 27IN ABS (SUTURE) ×2 IMPLANT
SUT VICRYL 4-0 PS2 18IN ABS (SUTURE) ×6 IMPLANT
TOWEL OR 17X24 6PK STRL BLUE (TOWEL DISPOSABLE) ×8 IMPLANT
TRAY FOLEY CATH 14FR (SET/KITS/TRAYS/PACK) ×4 IMPLANT
TROCAR XCEL NON-BLD 5MMX100MML (ENDOMECHANICALS) ×12 IMPLANT
WARMER LAPAROSCOPE (MISCELLANEOUS) ×4 IMPLANT
WATER STERILE IRR 1000ML POUR (IV SOLUTION) ×4 IMPLANT

## 2013-11-29 NOTE — Addendum Note (Signed)
Addendum created 11/29/13 1423 by Renford DillsJanet L Georgetta Crafton, CRNA   Modules edited: Notes Section   Notes Section:  File: [id number]

## 2013-11-29 NOTE — Anesthesia Postprocedure Evaluation (Signed)
  Anesthesia Post-op Note  Patient: Kerri HookDeana M Patterson  Procedure(s) Performed: Procedure(s): LAPAROSCOPIC ASSISTED VAGINAL HYSTERECTOMY (N/A) BILATERAL SALPINGECTOMY (Bilateral)  Patient Location: [facility]  Anesthesia Type:General  Level of Consciousness: awake  Airway and Oxygen Therapy: Patient Spontanous Breathing  Post-op Pain: mild  Post-op Assessment: Patient's Cardiovascular Status Stable and Respiratory Function Stable  Post-op Vital Signs: stable  Complications: No apparent anesthesia complications

## 2013-11-29 NOTE — Anesthesia Procedure Notes (Signed)
Procedure Name: Intubation Date/Time: 11/29/2013 7:30 AM Performed by: Faithlyn Recktenwald, Jannet AskewHARLESETTA Patterson Pre-anesthesia Checklist: Patient identified, Timeout performed, Emergency Drugs available, Suction available and Patient being monitored Patient Re-evaluated:Patient Re-evaluated prior to inductionOxygen Delivery Method: Circle system utilized Preoxygenation: Pre-oxygenation with 100% oxygen Intubation Type: IV induction Ventilation: Mask ventilation without difficulty Laryngoscope Size: Mac and 3 Grade View: Grade I Number of attempts: 1 Placement Confirmation: ETT inserted through vocal cords under direct vision,  breath sounds checked- equal and bilateral and positive ETCO2 Secured at: 20 cm Dental Injury: Teeth and Oropharynx as per pre-operative assessment

## 2013-11-29 NOTE — Brief Op Note (Signed)
11/29/2013  10:00 AM  PATIENT:  Kerri Patterson  38 y.o. female  PRE-OPERATIVE DIAGNOSIS:  Adenomyosis, dysmenorrhea, DUB  POST-OPERATIVE DIAGNOSIS:  same  PROCEDURE:  Procedure(s): LAPAROSCOPIC ASSISTED VAGINAL HYSTERECTOMY (N/A) BILATERAL SALPINGECTOMY (Bilateral)  SURGEON:  Surgeon(s) and Role:    * Sherron MondayJody Bovard, MD - Primary  ASSISTANTS: Huel Coteichardson, Kathy, MD   ANESTHESIA:   general  EBL:  Total I/O In: 2300 [I.V.:2300] Out: 1350 [Urine:1050; Blood:300]  BLOOD ADMINISTERED:none  DRAINS: Urinary Catheter (Foley)   LOCAL MEDICATIONS USED:  MARCAINE 10cc   and OTHER vasopressin 20cc  SPECIMEN:  Source of Specimen:  uterus, cervix, B tubes  DISPOSITION OF SPECIMEN:  PATHOLOGY  COUNTS:  YES  TOURNIQUET:  * No tourniquets in log *  DICTATION: .Other Dictation: Dictation Number [id number]  PLAN OF CARE: Admit for overnight observation  PATIENT DISPOSITION:  PACU - hemodynamically stable.   Delay start of Pharmacological VTE agent (>24hrs) due to surgical blood loss or risk of bleeding: not applicable

## 2013-11-29 NOTE — Transfer of Care (Signed)
Immediate Anesthesia Transfer of Care Note  Patient: Kerri Patterson  Procedure(s) Performed: Procedure(s): LAPAROSCOPIC ASSISTED VAGINAL HYSTERECTOMY (N/A) BILATERAL SALPINGECTOMY (Bilateral)  Patient Location: PACU  Anesthesia Type:General  Level of Consciousness: awake, alert  and oriented  Airway & Oxygen Therapy: Patient Spontanous Breathing and Patient connected to nasal cannula oxygen  Post-op Assessment: Report given to PACU RN and Post -op Vital signs reviewed and stable  Post vital signs: Reviewed and stable  Complications: No apparent anesthesia complications

## 2013-11-29 NOTE — Interval H&P Note (Signed)
History and Physical Interval Note:  11/29/2013 7:12 AM  Kerri Patterson  has presented today for surgery, with the diagnosis of Adenomyosis  The various methods of treatment have been discussed with the patient and family. After consideration of risks, benefits and other options for treatment, the patient has consented to  Procedure(s): LAPAROSCOPIC ASSISTED VAGINAL HYSTERECTOMY (N/A) BILATERAL SALPINGECTOMY (Bilateral) as a surgical intervention .  The patient's history has been reviewed, patient examined, no change in status, stable for surgery.  I have reviewed the patient's chart and labs.  Questions were answered to the patient's satisfaction.     BOVARD,Joseguadalupe Stan

## 2013-11-29 NOTE — Anesthesia Postprocedure Evaluation (Signed)
  Anesthesia Post-op Note Anesthesia Post Note  Patient: Kerri Patterson  Procedure(s) Performed: Procedure(s) (LRB): LAPAROSCOPIC ASSISTED VAGINAL HYSTERECTOMY (N/A) BILATERAL SALPINGECTOMY (Bilateral)  Anesthesia type: General  Patient location: PACU  Post pain: Pain level controlled  Post assessment: Post-op Vital signs reviewed  Last Vitals:  Filed Vitals:   11/29/13 1225  BP: 133/82  Pulse: 76  Temp: 37 C  Resp: 18    Post vital signs: Reviewed  Level of consciousness: sedated  Complications: No apparent anesthesia complications

## 2013-11-30 ENCOUNTER — Encounter (HOSPITAL_COMMUNITY): Payer: Self-pay | Admitting: Obstetrics and Gynecology

## 2013-11-30 LAB — BASIC METABOLIC PANEL
BUN: 5 mg/dL — ABNORMAL LOW (ref 6–23)
CALCIUM: 8.9 mg/dL (ref 8.4–10.5)
CHLORIDE: 105 meq/L (ref 96–112)
CO2: 26 meq/L (ref 19–32)
CREATININE: 0.69 mg/dL (ref 0.50–1.10)
GFR calc Af Amer: >90 mL/min (ref >90)
GFR calc non Af Amer: >90 mL/min (ref >90)
GLUCOSE: 91 mg/dL (ref 70–99)
Potassium: 3.8 mEq/L (ref 3.7–5.3)
Sodium: 140 mEq/L (ref 137–147)

## 2013-11-30 LAB — CBC
HEMATOCRIT: 28.2 % — AB (ref 36.0–46.0)
Hemoglobin: 8.2 g/dL — ABNORMAL LOW (ref 12.0–15.0)
MCH: 19.6 pg — AB (ref 26.0–34.0)
MCHC: 29.1 g/dL — ABNORMAL LOW (ref 30.0–36.0)
MCV: 67.5 fL — AB (ref 78.0–100.0)
PLATELETS: 222 10*3/uL (ref 150–400)
RBC: 4.18 MIL/uL (ref 3.87–5.11)
RDW: 21.8 % — AB (ref 11.5–15.5)
WBC: 9.5 10*3/uL (ref 4.0–10.5)

## 2013-11-30 MED ORDER — OXYCODONE-ACETAMINOPHEN 5-325 MG PO TABS: 1.0000 | ORAL_TABLET | ORAL | Status: DC | PRN

## 2013-11-30 MED ORDER — IBUPROFEN 800 MG PO TABS: 800.0000 mg | ORAL_TABLET | Freq: Three times a day (TID) | ORAL | Status: DC | PRN

## 2013-11-30 NOTE — Discharge Summary (Signed)
Physician Discharge Summary  Patient ID: Kerri Patterson MRN: [id number] DOB/AGE: [birth year] 38 y.o.  Admit date: 11/29/2013 Discharge date: 11/30/2013  Admission Diagnoses: DUB, dysmenorrhea  Discharge Diagnoses:  Principal Problem:   S/P laparoscopic assisted vaginal hysterectomy (LAVH) Active Problems:   Dysfunctional uterine bleeding   Dysmenorrhea   Discharged Condition: good  Hospital Course: Admitted 3/10 for LAVH, B salpingectomy, underwent surgery without complication.  D/C to home POD#1 when ambulating, voiding, tolerating PO, pain controlled with PO meds.    Consults: None  Significant Diagnostic Studies: labs: CBC and BMP  Treatments: IV hydration, analgesia: Dilaudid PCA and surgery: LAVH B salpingectomy  Discharge Exam: Blood pressure 119/74, pulse 74, temperature 98.4 F (36.9 C), temperature source Oral, resp. rate 16, height 5\' 6"  (1.676 m), weight 108.863 kg (240 lb), SpO2 100.00%. General appearance: alert and no distress GI: soft, non-tender; bowel sounds normal; no masses,  no organomegaly Incision/Wound:C/D/I  Disposition:   Discharge Orders   Future Orders Complete By Expires   Call MD for:  persistant nausea and vomiting  As directed    Call MD for:  redness, tenderness, or signs of infection (pain, swelling, redness, odor or green/yellow discharge around incision site)  As directed    Call MD for:  severe uncontrolled pain  As directed    Diet - low sodium heart healthy  As directed    Discharge instructions  As directed    Comments:     Call [phone] with questions or problems   Driving Restrictions  As directed    Comments:     While taking strong pain medicine   Increase activity slowly  As directed    Lifting restrictions  As directed    Comments:     No greater than 20-25 lbs   May shower / Bathe  As directed    May walk up steps  As directed    Sexual Activity Restrictions  As directed    Comments:     Pelvic rest - no douching,  tampons or sex for 6 weeks       Medication List         cyclobenzaprine 10 MG tablet  Commonly known as:  FLEXERIL  Take 10 mg by mouth daily as needed for muscle spasms.     diclofenac 75 MG EC tablet  Commonly known as:  VOLTAREN  Take 75 mg by mouth daily as needed for moderate pain.     Fish Oil 1200 MG Caps  Take 1 capsule by mouth daily.     ibuprofen 800 MG tablet  Commonly known as:  ADVIL,MOTRIN  Take 1 tablet (800 mg total) by mouth every 8 (eight) hours as needed (mild pain).     Iron 18 MG Tbcr  Take 1 tablet by mouth daily.     Magnesium 200 MG Tabs  Take 2 tablets by mouth at bedtime. Magnesium Malate 1300 mg (200 mg magnesium)     OVER THE COUNTER MEDICATION  Take 1 tablet by mouth daily. Vitamin D3 5000 IU + Vitamin K 1100 mcg + iodide 1000 mcg / tablet     oxyCODONE-acetaminophen 5-325 MG per tablet  Commonly known as:  PERCOCET/ROXICET  Take 1 tablet by mouth every 4 (four) hours as needed for severe pain.     POTASSIUM IODIDE PO  Take 1 tablet by mouth daily.     Vitamin B12-Folic Acid 500-400 MCG Tabs  Take 1 tablet by mouth daily.  Follow-up Information   Follow up with BOVARD,Kenyatta Keidel, MD In 2 weeks. (for incision check, pathology reviewed; 6 weeks for full post-op check)    Specialty:  Obstetrics and Gynecology   Contact information:   [address] [place] [id number] [phone]       Signed: BOVARD,Kerri Patterson 11/30/2013, 7:57 AM

## 2013-11-30 NOTE — Progress Notes (Signed)
Teaching complete ambulated out  

## 2013-11-30 NOTE — Op Note (Signed)
[name], Kerri Patterson                ACCOUNT NO.:  [id number]  MEDICAL RECORD NO.:  [id number]  LOCATION:  9311                          FACILITY:  [facility]  PHYSICIAN:  Sherron Monday, MD        DATE OF BIRTH:  [birth year]  DATE OF PROCEDURE:  11/29/2013 DATE OF DISCHARGE:                              OPERATIVE REPORT   PREOPERATIVE DIAGNOSES:  Adenomyosis, dysmenorrhea, dysfunctional uterine bleeding.  PROCEDURE:  Laparoscopic-assisted vaginal hysterectomy, bilateral salpingectomy.  SURGEON:  Sherron Monday, MD  ASSISTANT:  Huel Cote, MD  ANESTHESIA:  General.  EBL:  300 mL.  URINE OUTPUT:  1050 mL clear urine at the end of procedure.  IV FLUIDS:  2300 mL.  FINDINGS:  Mildly enlarged uterus, somewhat boggy appearing as well as normal tubes and ovaries bilaterally.  PATHOLOGY:  Uterus, cervix, and bilateral tubes to pathology.  COMPLICATIONS:  None.  PROCEDURE:  After informed consent was reviewed with the patient and her husband, she was transported to the OR, placed on the table in supine position.  General anesthesia was induced and found to be adequate.  She was then placed in the elephant stirrups and prepped and draped in normal sterile fashion.  A Foley catheter was sterilely placed in her bladder.  An infraumbilical incision was made using the Veress needle. Pneumoperitoneum was obtained.  Bilateral accessory ports were placed after carefully surveying where the epigastric vessels were and under direct visualization.  The patient was placed and sutured in a Lembert. A survey revealed the above-mentioned findings.  Initially, the tube was grasped at the fimbriated end and skimmed from the mesosalpinx.  The round ligaments were then incised down to the level of the uterine arteries in a stepwise fashion, using the Harmonic Scalpel.  Bladder flap was created.  This was repeated on the right side.  Attention was turned to vaginal portion of the case.  Using a  heavy weighted speculum, the cervix was grasped with tenaculum anteriorly and posteriorly.  It was insufflated with vasopressin and then circumferentially incised with Bovie cautery.  Attempt was made to enter anteriorly but this was unsuccessful.  Attention was turned to the posterior.  The posterior cul-de-sac was entered successfully.  The banano speculum was placed.  Bilaterally the uterosacral ligaments were ligated and held in a stepwise fashion to the level of the cornu.  This was repeated bilaterally.  The uterus was flipped and delivered and after delivery the pedicles were inspected and found to be hemostatic. There was some bleeding bilaterally that was controlled with 3-0 Vicryl, as well as Bovie cautery.  The cuff was debrided with a running locked suture.  The uterosacral ligaments were plicated.  The cuff was closed with 2-0 Vicryl in a running fashion.  The gloves were changed. Attention was returned to the abdominal portion of the case.  Hemostasis was assured.  Both ovaries were inspected closely as well as the cuff. The pneumoperitoneum was evacuated.  The incisions were closed with a single suture of 4-0 Vicryl and Dermabond.  The patient tolerated the procedure well.  Sponge, lap, and needle count was correct x2.     Sherron Monday, MD  JB/MEDQ  D:  11/29/2013  T:  11/30/2013  Job:  [id number]

## 2013-11-30 NOTE — Progress Notes (Signed)
1 Day Post-Op Procedure(s) (LRB): LAPAROSCOPIC ASSISTED VAGINAL HYSTERECTOMY (N/A) BILATERAL SALPINGECTOMY (Bilateral)  Subjective: Patient reports tolerating PO.  Some cramping pain, no N/V, has been up and walking.    Objective: I have reviewed patient's vital signs, intake and output and labs.  General: alert and no distress GI: incision: clean, dry and intact Abdomen - soft, app tender, ND  Assessment: s/p Procedure(s): LAPAROSCOPIC ASSISTED VAGINAL HYSTERECTOMY (N/A) BILATERAL SALPINGECTOMY (Bilateral): stable, progressing well, tolerating diet and pain controlled.    Plan: Encourage ambulation Advance to PO medication Discontinue IV fluids Discharge home when meeting criteria  LOS: 1 day    BOVARD,Beckam Abdulaziz 11/30/2013, 7:50 AM

## 2014-09-10 ENCOUNTER — Encounter (HOSPITAL_BASED_OUTPATIENT_CLINIC_OR_DEPARTMENT_OTHER): Payer: Self-pay | Admitting: *Deleted

## 2014-09-10 ENCOUNTER — Emergency Department (HOSPITAL_BASED_OUTPATIENT_CLINIC_OR_DEPARTMENT_OTHER): Payer: BC Managed Care – PPO

## 2014-09-10 ENCOUNTER — Emergency Department (HOSPITAL_BASED_OUTPATIENT_CLINIC_OR_DEPARTMENT_OTHER)
Admission: EM | Admit: 2014-09-10 | Discharge: 2014-09-10 | Disposition: A | Discharge status: Home / Self Care | Payer: BC Managed Care – PPO | Attending: Emergency Medicine | Admitting: Emergency Medicine

## 2014-09-10 DIAGNOSIS — Y9289 Other specified places as the place of occurrence of the external cause: Secondary | ICD-10-CM | POA: Diagnosis not present

## 2014-09-10 DIAGNOSIS — Z791 Long term (current) use of non-steroidal anti-inflammatories (NSAID): Secondary | ICD-10-CM | POA: Diagnosis not present

## 2014-09-10 DIAGNOSIS — Y9301 Activity, walking, marching and hiking: Secondary | ICD-10-CM | POA: Diagnosis not present

## 2014-09-10 DIAGNOSIS — Z8742 Personal history of other diseases of the female genital tract: Secondary | ICD-10-CM | POA: Insufficient documentation

## 2014-09-10 DIAGNOSIS — S82832A Other fracture of upper and lower end of left fibula, initial encounter for closed fracture: Secondary | ICD-10-CM

## 2014-09-10 DIAGNOSIS — W108XXA Fall (on) (from) other stairs and steps, initial encounter: Secondary | ICD-10-CM | POA: Diagnosis not present

## 2014-09-10 DIAGNOSIS — Z79899 Other long term (current) drug therapy: Secondary | ICD-10-CM | POA: Diagnosis not present

## 2014-09-10 DIAGNOSIS — Y998 Other external cause status: Secondary | ICD-10-CM | POA: Insufficient documentation

## 2014-09-10 DIAGNOSIS — S99912A Unspecified injury of left ankle, initial encounter: Secondary | ICD-10-CM | POA: Diagnosis present

## 2014-09-10 DIAGNOSIS — W19XXXA Unspecified fall, initial encounter: Secondary | ICD-10-CM

## 2014-09-10 DIAGNOSIS — S8265XA Nondisplaced fracture of lateral malleolus of left fibula, initial encounter for closed fracture: Secondary | ICD-10-CM | POA: Diagnosis not present

## 2014-09-10 MED ORDER — OXYCODONE-ACETAMINOPHEN 5-325 MG PO TABS: 1.0000 | ORAL_TABLET | Freq: Four times a day (QID) | ORAL | Status: DC | PRN

## 2014-09-10 MED ORDER — ONDANSETRON 8 MG PO TBDP
8.0000 mg | ORAL_TABLET | Freq: Once | ORAL | Status: AC
  Administered 2014-09-10: 8 mg via ORAL
  Filled 2014-09-10: qty 1

## 2014-09-10 MED ORDER — OXYCODONE-ACETAMINOPHEN 5-325 MG PO TABS
2.0000 | ORAL_TABLET | Freq: Once | ORAL | Status: AC
  Administered 2014-09-10: 2 via ORAL
  Filled 2014-09-10: qty 2

## 2014-09-10 NOTE — ED Provider Notes (Signed)
CSN: [id number]     Arrival date & time 09/10/14  1937 History   First MD Initiated Contact with Patient 09/10/14 2008     Chief Complaint  Patient presents with  . Ankle Pain     (Consider location/radiation/quality/duration/timing/severity/associated sxs/prior Treatment) HPI   38 year old female who presents for evaluation of left ankle injury. Patient states approximately an hour ago she was walking down the steps, missed step and fell twisted her left ankle. Report acute onset of sharp throbbing pain to the lateral aspects of her ankle. Pain is nonradiating, she was unable to ambulate afterward. She denies any knee or hip injury. She denies hitting her head or loss of consciousness. No specific treatment tried. States she felt nauseous after the fall but denies any precipitating symptoms prior to the fall. Her pain is currently 10 out of 10. She denies any numbness. No prior injury to the same ankle in the past.   Past Medical History  Diagnosis Date  . Complication of anesthesia     Epidural "went up to chest instead of down"  . Medical history non-contributory   . Dysfunctional uterine bleeding 11/28/2013  . Dysmenorrhea 11/28/2013  . S/P laparoscopic assisted vaginal hysterectomy (LAVH) 11/29/2013   Past Surgical History  Procedure Laterality Date  . Invitro fertilization    . Laparoscopic assisted vaginal hysterectomy N/A 11/29/2013    Procedure: LAPAROSCOPIC ASSISTED VAGINAL HYSTERECTOMY;  Surgeon: Sherron MondayJody Bovard, MD;  Location: [facility];  Service: Gynecology;  Laterality: N/A;  . Bilateral salpingectomy Bilateral 11/29/2013    Procedure: BILATERAL SALPINGECTOMY;  Surgeon: Sherron MondayJody Bovard, MD;  Location: [facility];  Service: Gynecology;  Laterality: Bilateral;   History reviewed. No pertinent family history. History  Substance Use Topics  . Smoking status: Never Smoker   . Smokeless tobacco: Not on file  . Alcohol Use: Yes     Comment: very rare   OB History    No data available      Review of Systems  Constitutional: Negative for fever.  Musculoskeletal: Positive for joint swelling.  Skin: Negative for rash.  Neurological: Negative for numbness.      Allergies  Review of patient's allergies indicates no known allergies.  Home Medications   Prior to Admission medications   Medication Sig Start Date End Date Taking? Authorizing Provider  Cobalamine Combinations (VITAMIN B12-FOLIC ACID) 500-400 MCG TABS Take 1 tablet by mouth daily.    Historical Provider, MD  cyclobenzaprine (FLEXERIL) 10 MG tablet Take 10 mg by mouth daily as needed for muscle spasms.    Historical Provider, MD  diclofenac (VOLTAREN) 75 MG EC tablet Take 75 mg by mouth daily as needed for moderate pain.    Historical Provider, MD  Ferrous Fumarate (IRON) 18 MG TBCR Take 1 tablet by mouth daily.    Historical Provider, MD  ibuprofen (ADVIL,MOTRIN) 800 MG tablet Take 1 tablet (800 mg total) by mouth every 8 (eight) hours as needed (mild pain). 11/30/13   Sherian ReinJody Bovard-Stuckert, MD  Magnesium 200 MG TABS Take 2 tablets by mouth at bedtime. Magnesium Malate 1300 mg (200 mg magnesium)    Historical Provider, MD  Omega-3 Fatty Acids (FISH OIL) 1200 MG CAPS Take 1 capsule by mouth daily.    Historical Provider, MD  OVER THE COUNTER MEDICATION Take 1 tablet by mouth daily. Vitamin D3 5000 IU + Vitamin K 1100 mcg + iodide 1000 mcg / tablet    Historical Provider, MD  oxyCODONE-acetaminophen (PERCOCET/ROXICET) 5-325 MG per tablet Take 1 tablet by  mouth every 4 (four) hours as needed for severe pain. 11/30/13   Jody Bovard-Stuckert, MD  POTASSIUM IODIDE PO Take 1 tablet by mouth daily.    Historical Provider, MD   BP 143/90 mmHg  Pulse 77  Temp(Src) 98.6 F (37 C)  Resp 16  Ht 5\' 6"  (1.676 m)  Wt 220 lb (99.791 kg)  BMI 35.53 kg/m2  SpO2 98%  LMP 11/12/2013 Physical Exam  Constitutional: She appears well-developed and well-nourished. No distress.  HENT:  Head: Atraumatic.  Eyes: Conjunctivae are  normal.  Neck: Neck supple.  Musculoskeletal: She exhibits tenderness (Left ankle: Point tenderness to lateral malleolus region with significant edema and crepitus but no gross deformity. No pain at medial and posterior malleoli region. Decreased ankle range of motion secondary to pain. Pedal pulse palpable with brisk cap ref).  Neurological: She is alert.  Skin: No rash noted.  Psychiatric: She has a normal mood and affect.  Nursing note and vitals reviewed.   ED Course  Procedures (including critical care time)  8:23 PM Patient with a mechanical fall injuring her left ankle. X-ray demonstrate an acute distal fibular fracture. This is a nondisplaced fracture. This is a closed injury. She is neurovascularly intact. Plan to provide ankle splint, crutches, pain medication. Patient will follow-up closely with orthopedic provider for further management.  Labs Review Labs Reviewed - No data to display  Imaging Review Dg Ankle Complete Left  09/10/2014   CLINICAL DATA:  Status post fall down steps today. Lateral left ankle pain and swelling. Initial encounter.  EXAM: LEFT ANKLE COMPLETE - 3+ VIEW  COMPARISON:  None.  FINDINGS: The patient has a nondisplaced fracture of the distal fibula. The fracture is oblique in orientation extending from 7.5 cm above the tip of the lateral malleolus to the level of the joint line. No other acute abnormality is identified.  IMPRESSION: Acute distal fibular fracture as described.   Electronically Signed   By: Drusilla Kannerhomas  Dalessio M.D.   On: 09/10/2014 20:14     EKG Interpretation None      MDM   Final diagnoses:  Traumatic closed nondisplaced fracture of distal end of left fibula, initial encounter    BP 143/90 mmHg  Pulse 77  Temp(Src) 98.6 F (37 C)  Resp 16  Ht 5\' 6"  (1.676 m)  Wt 220 lb (99.791 kg)  BMI 35.53 kg/m2  SpO2 98%  LMP 11/12/2013  I have reviewed nursing notes and vital signs. I personally reviewed the imaging tests through PACS  system  I reviewed available ER/hospitalization records thought the EMR     Fayrene HelperBowie Aurelie Dicenzo, PA-C 09/10/14 2053  Rolan BuccoMelanie Belfi, MD 09/10/14 2337

## 2014-09-10 NOTE — ED Notes (Signed)
I applied a short leg posterior splint with stirrup application using fiberglass over sock material, cotton webril, then secured with ace. Crutch fit and training completed.

## 2014-09-10 NOTE — ED Notes (Signed)
Pt c/o left ankle injury x 30 mins ago

## 2014-09-10 NOTE — Discharge Instructions (Signed)
Fibular Fracture, Ankle, Adult, Undisplaced, Treated With Immobilization °A simple fracture of the bone below the knee on the outside of your leg (fibula) usually heals without problems. °CAUSES °Typically, a fibular fracture occurs as a result of trauma. A blow to the side of your leg or a powerful twisting movement can cause a fracture. Fibular fractures are often seen as a result of football, soccer, or skiing injuries. °SYMPTOMS °Symptoms of a fibular fracture can include: °· Pain. °· Shortening or abnormal alignment of your lower leg (angulation). °DIAGNOSIS °A health care provider will need to examine the leg. X-ray exams will be ordered for further to confirm the fracture and evaluate the extent and of the injury. °TREATMENT  °Typically, a cast or immobilizer is applied. Sometimes a splint is placed on these fractures if it is needed for comfort or if the bones are badly out of place. Crutches may be needed to help you get around.  °HOME CARE INSTRUCTIONS  °· Apply ice to the injured area: °¨ Put ice in a plastic bag. °¨ Place a towel between your skin and the bag. °¨ Leave the ice on for 20 minutes, 2-3 times a day. °· Use crutches as directed. Resume walking without crutches as directed by your health care provider or when comfortable doing so. °· Only take over-the-counter or prescription medicines for pain, discomfort, or fever as directed by your health care provider. °· Keeping your leg raised may lessen swelling. °· If you have a removable splint or boot, do not remove the boot unless directed by your health care provider. °· Do not not drive a car or operate a motor vehicle until your health care provider specifically tells you it is safe to do so. °SEEK IMMEDIATE MEDICAL CARE IF:  °· Your cast gets damaged or breaks. °· You have continued severe pain or more swelling than you did before the cast was put on, or the pain is not controlled with medications. °· Your skin or nails below the injury turn  blue or grey, or feel cold or numb. °· There is a bad smell or pus coming from under the cast. °· You develop severe pain in ankle or foot. °MAKE SURE YOU:  °· Understand these instructions. °· Will watch your condition. °· Will get help right away if you are not doing well or get worse. °Document Released: 05/31/2002 Document Revised: 06/29/2013 Document Reviewed: 04/20/2013 °[employer]® Patient Information ©2015 [employer]. This information is not intended to replace advice given to you by your health care provider. Make sure you discuss any questions you have with your health care provider. ° °

## 2014-09-11 NOTE — ED Notes (Signed)
Incorrect referral given yesterday for othro md.  Confirmed with [facility], Pt called and referred to D.r Teryl LucyJoshua Landau.

## 2017-05-05 ENCOUNTER — Emergency Department (HOSPITAL_BASED_OUTPATIENT_CLINIC_OR_DEPARTMENT_OTHER): Payer: BLUE CROSS/BLUE SHIELD

## 2017-05-05 ENCOUNTER — Emergency Department (HOSPITAL_BASED_OUTPATIENT_CLINIC_OR_DEPARTMENT_OTHER)
Admission: EM | Admit: 2017-05-05 | Discharge: 2017-05-05 | Disposition: A | Discharge status: Home / Self Care | Payer: BLUE CROSS/BLUE SHIELD | Attending: Emergency Medicine | Admitting: Emergency Medicine

## 2017-05-05 ENCOUNTER — Encounter (HOSPITAL_BASED_OUTPATIENT_CLINIC_OR_DEPARTMENT_OTHER): Payer: Self-pay | Admitting: *Deleted

## 2017-05-05 DIAGNOSIS — E079 Disorder of thyroid, unspecified: Secondary | ICD-10-CM | POA: Insufficient documentation

## 2017-05-05 DIAGNOSIS — R1011 Right upper quadrant pain: Secondary | ICD-10-CM | POA: Insufficient documentation

## 2017-05-05 DIAGNOSIS — Z79899 Other long term (current) drug therapy: Secondary | ICD-10-CM | POA: Diagnosis not present

## 2017-05-05 HISTORY — DX: Disorder of thyroid, unspecified: E07.9

## 2017-05-05 LAB — CBC WITH DIFFERENTIAL/PLATELET
BASOS PCT: 0 %
Basophils Absolute: 0 10*3/uL (ref 0.0–0.1)
Eosinophils Absolute: 0.1 10*3/uL (ref 0.0–0.7)
Eosinophils Relative: 1 %
HCT: 44.6 % (ref 36.0–46.0)
HEMOGLOBIN: 15.3 g/dL — AB (ref 12.0–15.0)
LYMPHS ABS: 2 10*3/uL (ref 0.7–4.0)
Lymphocytes Relative: 28 %
MCH: 27.4 pg (ref 26.0–34.0)
MCHC: 34.3 g/dL (ref 30.0–36.0)
MCV: 79.9 fL (ref 78.0–100.0)
MONO ABS: 0.6 10*3/uL (ref 0.1–1.0)
Monocytes Relative: 8 %
NEUTROS PCT: 63 %
Neutro Abs: 4.4 10*3/uL (ref 1.7–7.7)
Platelets: 221 10*3/uL (ref 150–400)
RBC: 5.58 MIL/uL — ABNORMAL HIGH (ref 3.87–5.11)
RDW: 14 % (ref 11.5–15.5)
WBC: 7.1 10*3/uL (ref 4.0–10.5)

## 2017-05-05 LAB — URINALYSIS, ROUTINE W REFLEX MICROSCOPIC
Bilirubin Urine: NEGATIVE
Glucose, UA: NEGATIVE mg/dL
HGB URINE DIPSTICK: NEGATIVE
Ketones, ur: >80 mg/dL — AB
Leukocytes, UA: NEGATIVE
NITRITE: NEGATIVE
Protein, ur: NEGATIVE mg/dL
SPECIFIC GRAVITY, URINE: 1.005 (ref 1.005–1.030)
pH: 5 (ref 5.0–8.0)

## 2017-05-05 LAB — COMPREHENSIVE METABOLIC PANEL
ALK PHOS: 69 U/L (ref 38–126)
ALT: 15 U/L (ref 14–54)
AST: 16 U/L (ref 15–41)
Albumin: 4.4 g/dL (ref 3.5–5.0)
Anion gap: 15 (ref 5–15)
BUN: 6 mg/dL (ref 6–20)
CHLORIDE: 103 mmol/L (ref 101–111)
CO2: 22 mmol/L (ref 22–32)
CREATININE: 0.65 mg/dL (ref 0.44–1.00)
Calcium: 9.4 mg/dL (ref 8.9–10.3)
Glucose, Bld: 80 mg/dL (ref 65–99)
Potassium: 4.1 mmol/L (ref 3.5–5.1)
Sodium: 140 mmol/L (ref 135–145)
Total Bilirubin: 0.8 mg/dL (ref 0.3–1.2)
Total Protein: 7.2 g/dL (ref 6.5–8.1)

## 2017-05-05 LAB — LIPASE, BLOOD: Lipase: 30 U/L (ref 11–51)

## 2017-05-05 MED ORDER — MORPHINE SULFATE (PF) 4 MG/ML IV SOLN
4.0000 mg | Freq: Once | INTRAVENOUS | Status: AC
  Administered 2017-05-05: 4 mg via INTRAVENOUS
  Filled 2017-05-05: qty 1

## 2017-05-05 MED ORDER — ONDANSETRON HCL 4 MG/2ML IJ SOLN
4.0000 mg | Freq: Once | INTRAMUSCULAR | Status: AC
  Administered 2017-05-05: 4 mg via INTRAVENOUS
  Filled 2017-05-05: qty 2

## 2017-05-05 MED ORDER — IOPAMIDOL (ISOVUE-300) INJECTION 61%
100.0000 mL | Freq: Once | INTRAVENOUS | Status: AC | PRN
  Administered 2017-05-05: 100 mL via INTRAVENOUS

## 2017-05-05 MED ORDER — SODIUM CHLORIDE 0.9 % IV BOLUS (SEPSIS)
1000.0000 mL | Freq: Once | INTRAVENOUS | Status: AC
  Administered 2017-05-05: 1000 mL via INTRAVENOUS

## 2017-05-05 MED ORDER — HYDROCODONE-ACETAMINOPHEN 5-325 MG PO TABS: 1.0000 | ORAL_TABLET | Freq: Four times a day (QID) | ORAL | 0 refills | Status: DC | PRN

## 2017-05-05 NOTE — ED Provider Notes (Signed)
[facility]   CSN: [id number] Arrival date & time: 05/05/17  1808     History   Chief Complaint Chief Complaint  Patient presents with  . Abdominal Pain    HPI Kerri Patterson is a 41 y.o. female.  Patient is a 41 year old female with history of hysterectomy/oophorectomy, thyroid surgery presenting for evaluation of epigastric and right upper quadrant pain that has worsened over the past 3 days. Is worse with eating, specifically fatty foods. She recently started eating a ketogenic diet. She denies any fevers or chills. Her pain is worse with inspiration and radiates into her back.   The history is provided by the patient.  Abdominal Pain   This is a new problem. Episode onset: 3 days ago. The problem occurs constantly. The problem has been rapidly worsening. The pain is associated with eating. The pain is located in the RUQ and epigastric region. The quality of the pain is sharp. The pain is moderate. Pertinent negatives include fever, flatus, melena, constipation and dysuria. The symptoms are aggravated by certain positions, deep breathing and palpation. Nothing relieves the symptoms.    Past Medical History:  Diagnosis Date  . Complication of anesthesia    Epidural "went up to chest instead of down"  . Dysfunctional uterine bleeding 11/28/2013  . Dysmenorrhea 11/28/2013  . Medical history non-contributory   . S/P laparoscopic assisted vaginal hysterectomy (LAVH) 11/29/2013  . Thyroid disease     Patient Active Problem List   Diagnosis Date Noted  . S/P laparoscopic assisted vaginal hysterectomy (LAVH) 11/29/2013  . Dysfunctional uterine bleeding 11/28/2013  . Dysmenorrhea 11/28/2013    Past Surgical History:  Procedure Laterality Date  . BILATERAL SALPINGECTOMY Bilateral 11/29/2013   Procedure: BILATERAL SALPINGECTOMY;  Surgeon: Sherron MondayJody Bovard, MD;  Location: [facility];  Service: Gynecology;  Laterality: Bilateral;  . Invitro fertilization    .  LAPAROSCOPIC ASSISTED VAGINAL HYSTERECTOMY N/A 11/29/2013   Procedure: LAPAROSCOPIC ASSISTED VAGINAL HYSTERECTOMY;  Surgeon: Sherron MondayJody Bovard, MD;  Location: [facility];  Service: Gynecology;  Laterality: N/A;    OB History    No data available       Home Medications    Prior to Admission medications   Medication Sig Start Date End Date Taking? Authorizing Provider  Levothyroxine Sodium (SYNTHROID PO) Take by mouth.   Yes [provider]  Cobalamine Combinations (VITAMIN B12-FOLIC ACID) 500-400 MCG TABS Take 1 tablet by mouth daily.    [provider]  cyclobenzaprine (FLEXERIL) 10 MG tablet Take 10 mg by mouth daily as needed for muscle spasms.    [provider]  diclofenac (VOLTAREN) 75 MG EC tablet Take 75 mg by mouth daily as needed for moderate pain.    [provider]  Ferrous Fumarate (IRON) 18 MG TBCR Take 1 tablet by mouth daily.    [provider]  ibuprofen (ADVIL,MOTRIN) 800 MG tablet Take 1 tablet (800 mg total) by mouth every 8 (eight) hours as needed (mild pain). 11/30/13   Bovard-Stuckert, Augusto GambleJody, MD  Magnesium 200 MG TABS Take 2 tablets by mouth at bedtime. Magnesium Malate 1300 mg (200 mg magnesium)    [provider]  Omega-3 Fatty Acids (FISH OIL) 1200 MG CAPS Take 1 capsule by mouth daily.    [provider]  OVER THE COUNTER MEDICATION Take 1 tablet by mouth daily. Vitamin D3 5000 IU + Vitamin K 1100 mcg + iodide 1000 mcg / tablet    [provider]  oxyCODONE-acetaminophen (PERCOCET/ROXICET) 5-325  MG per tablet Take 1 tablet by mouth every 6 (six) hours as needed for severe pain. 09/10/14   Fayrene Helper, PA-C  POTASSIUM IODIDE PO Take 1 tablet by mouth daily.    [provider]    Family History No family history on file.  Social History Social History  Substance Use Topics  . Smoking status: Never Smoker  . Smokeless tobacco: Never Used  . Alcohol use Yes     Comment: very rare      Allergies   Patient has no known allergies.   Review of Systems Review of Systems  Constitutional: Negative for fever.  Gastrointestinal: Positive for abdominal pain. Negative for constipation, flatus and melena.  Genitourinary: Negative for dysuria.  All other systems reviewed and are negative.    Physical Exam Updated Vital Signs BP (!) 146/93 (BP Location: Left Arm)   Pulse (!) 104   Temp 98 F (36.7 C) (Oral)   Resp 20   Ht 5\' 6"  (1.676 m)   Wt 99.8 kg (220 lb)   LMP 11/12/2013   SpO2 100%   BMI 35.51 kg/m   Physical Exam  Constitutional: She is oriented to person, place, and time. She appears well-developed and well-nourished. No distress.  HENT:  Head: Normocephalic and atraumatic.  Neck: Normal range of motion. Neck supple.  Cardiovascular: Normal rate and regular rhythm.  Exam reveals no gallop and no friction rub.   No murmur heard. Pulmonary/Chest: Effort normal and breath sounds normal. No respiratory distress. She has no wheezes.  Abdominal: Soft. Bowel sounds are normal. She exhibits no distension. There is tenderness. There is no rebound and no guarding.  There is tenderness to palpation in the right upper quadrant and epigastric region.  Musculoskeletal: Normal range of motion.  Neurological: She is alert and oriented to person, place, and time.  Skin: Skin is warm and dry. She is not diaphoretic.  Nursing note and vitals reviewed.    ED Treatments / Results  Labs (all labs ordered are listed, but only abnormal results are displayed) Labs Reviewed  CBC WITH DIFFERENTIAL/PLATELET  LIPASE, BLOOD  COMPREHENSIVE METABOLIC PANEL  URINALYSIS, ROUTINE W REFLEX MICROSCOPIC    EKG  EKG Interpretation None       Radiology No results found.  Procedures Procedures (including critical care time)  Medications Ordered in ED Medications  sodium chloride 0.9 % bolus 1,000 mL (not administered)  ondansetron (ZOFRAN) injection 4 mg (not  administered)  morphine 4 MG/ML injection 4 mg (not administered)     Initial Impression / Assessment and Plan / ED Course  I have reviewed the triage vital signs and the nursing notes.  Pertinent labs & imaging results that were available during my care of the patient were reviewed by me and considered in my medical decision making (see chart for details).  Patient presents with complaints of epigastric and right upper quadrant pain. Her symptoms initially were most consistent with biliary colic. Her workup does not show this. She has no white count, normal LFTs, and ultrasound and CT scan are all unremarkable. I do not have a good explanation for her pain, however nothing today appears emergent. She will be discharged with pain medicine and I will make arrangements for an outpatient HIDA scan to further evaluate her gallbladder emptying. I will also give her follow-up information for gastroenterology.  Final Clinical Impressions(s) / ED Diagnoses   Final diagnoses:  RUQ pain    New Prescriptions New Prescriptions   No medications  on file     Geoffery Lyons, MD 05/05/17 2321

## 2017-05-05 NOTE — ED Notes (Addendum)
Ass[place] care of patient from [place]. Pt resting quietly. No distress. Awaiting ultrasound report. Pt comfortable. Pt updated on wait time. Pt reports ongoing RUQ pain.

## 2017-05-05 NOTE — Discharge Instructions (Signed)
Hydrocodone as prescribed as needed for pain.  HIDA scan as an outpatient as scheduled.  Return to the emergency department if you develop worsening pain, high fever, bloody stool, or other new and concerning symptoms.

## 2017-05-05 NOTE — ED Notes (Signed)
Patient transported to Ultrasound 

## 2017-05-05 NOTE — ED Triage Notes (Signed)
Right upper quadrant pain and now epigastric pain. Nausea.

## 2018-03-15 IMAGING — CT CT ABD-PELV W/ CM
2 of 5 series · 16 of 46 positions shown, 18 images · IV contrast (APPLIED)
Comparison: Right upper quadrant ultrasound performed earlier today
at [DATE] p.m., and MRI of the lumbar spine performed 05/30/2004

CLINICAL DATA: Acute onset of right upper quadrant abdominal pain
and epigastric pain. Nausea. Initial encounter.

EXAM:
CT ABDOMEN AND PELVIS WITH CONTRAST
TECHNIQUE: Multidetector CT imaging of the abdomen and pelvis was performed
using the standard protocol following bolus administration of
intravenous contrast.
CONTRAST:  100mL Z6P2G9-3MM IOPAMIDOL (Z6P2G9-3MM) INJECTION 61%

[Series 2: axial st · axial · 0.87mm/px · z∈[-442,-2]mm · 13 of 100 slices shown, 15 images]
[im 6/100  soft-tissue]
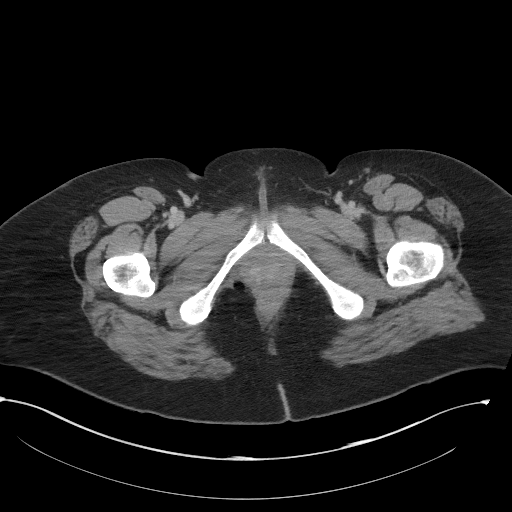
[im 6/100  bone]
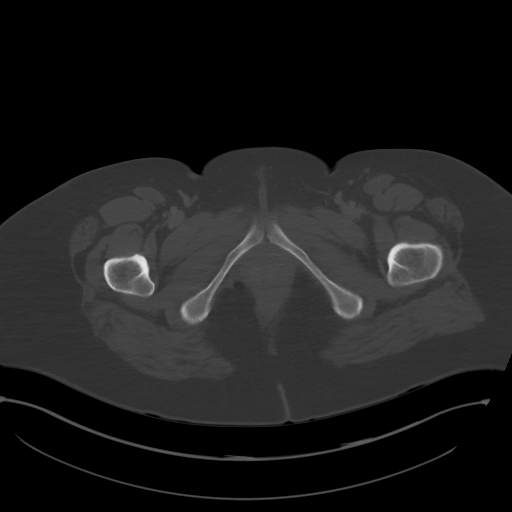
[im 12/100  soft-tissue]
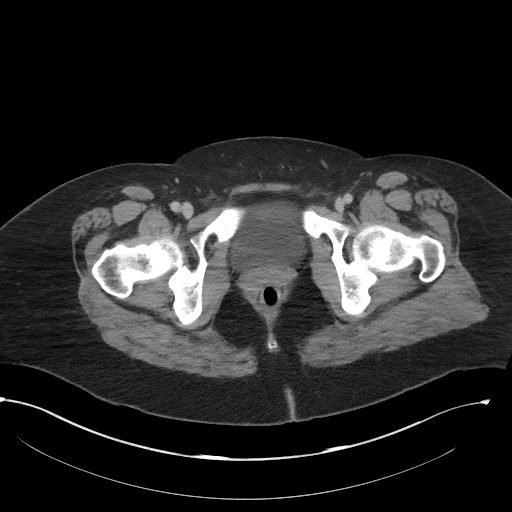
[im 23/100  soft-tissue]
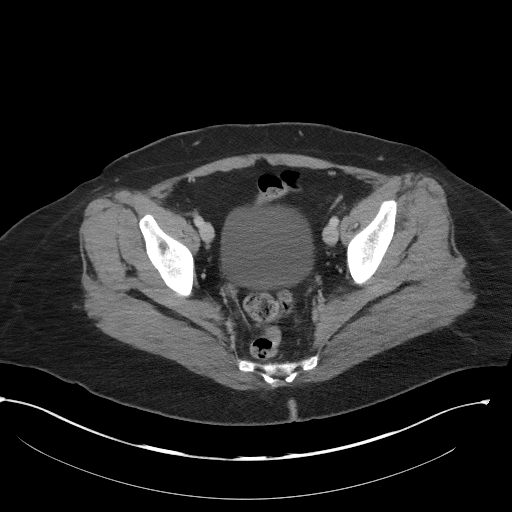
[im 28/100  soft-tissue]
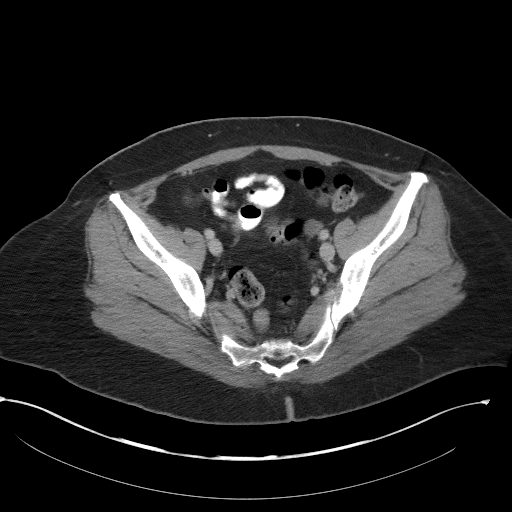
[im 34/100  soft-tissue]
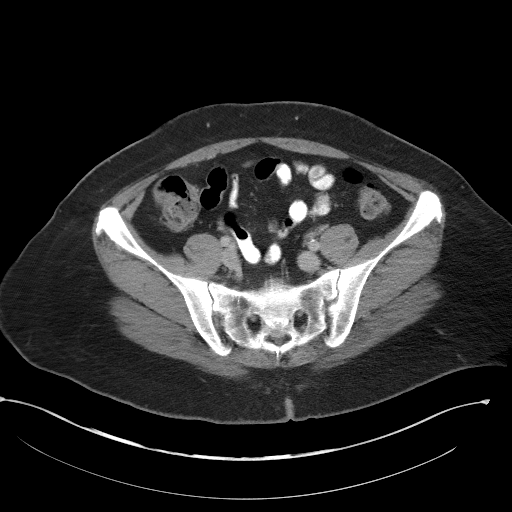
[im 45/100  soft-tissue]
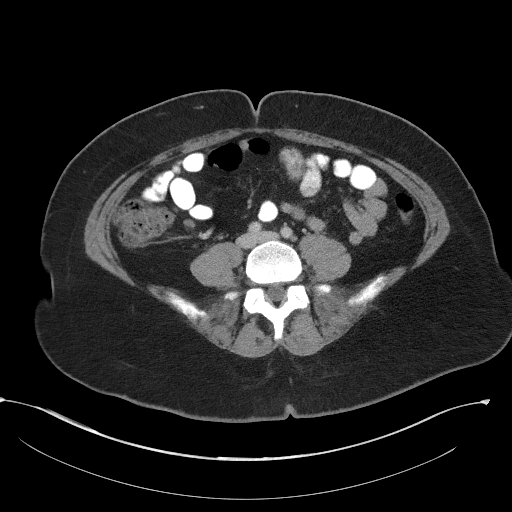
[im 50/100  soft-tissue]
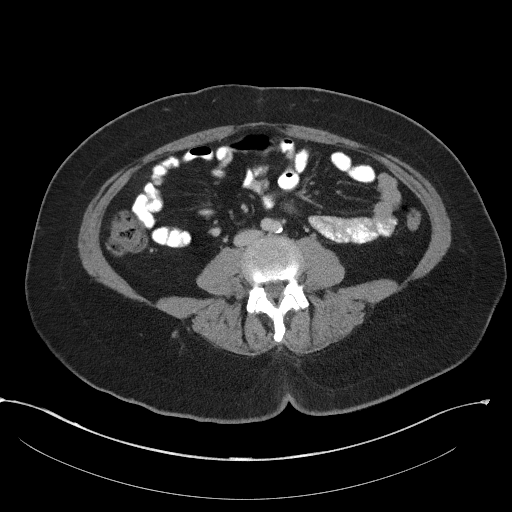
[im 56/100  soft-tissue]
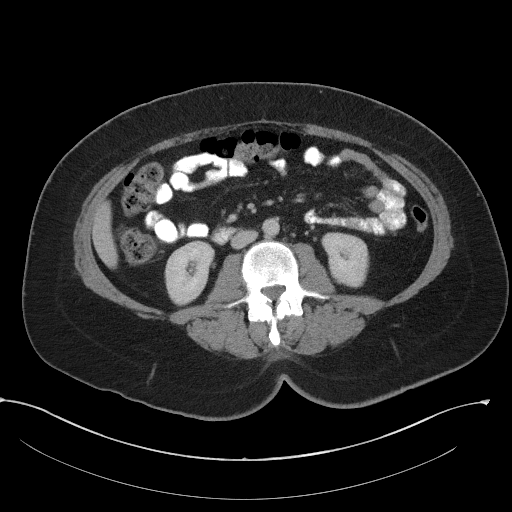
[im 67/100  soft-tissue]
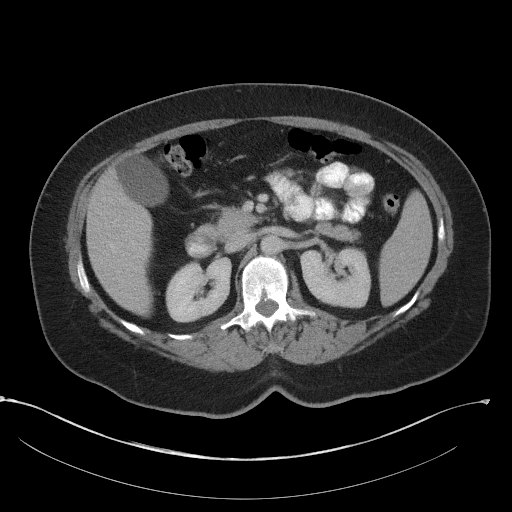
[im 67/100  bone]
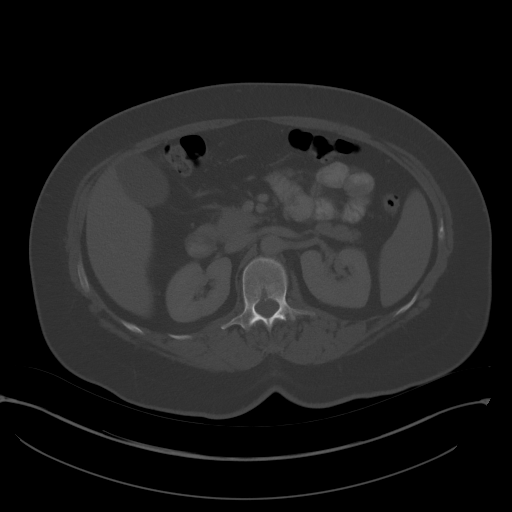
[im 72/100  soft-tissue]
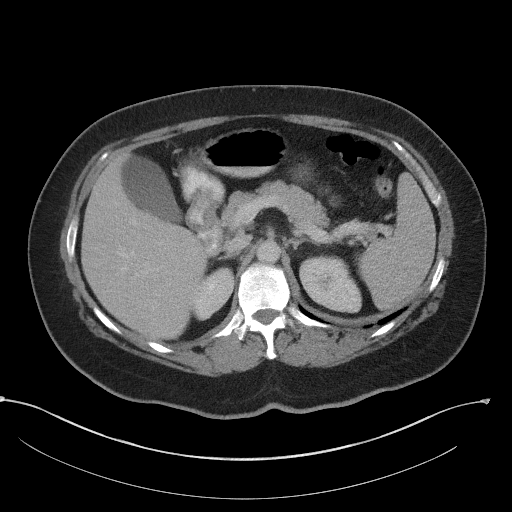
[im 78/100  soft-tissue]
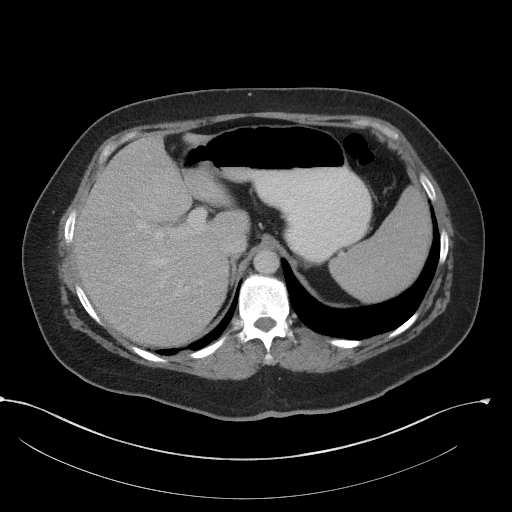
[im 89/100  soft-tissue]
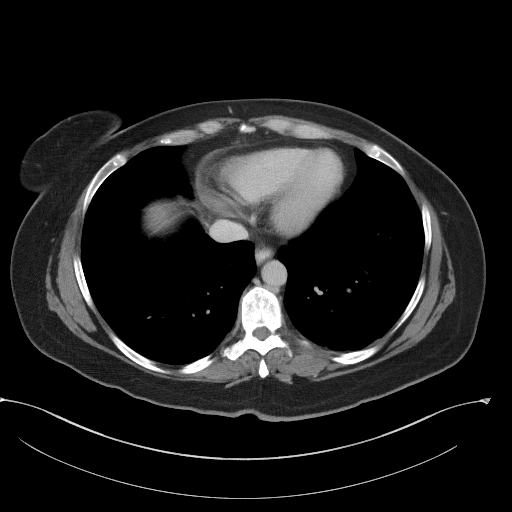
[im 94/100  soft-tissue]
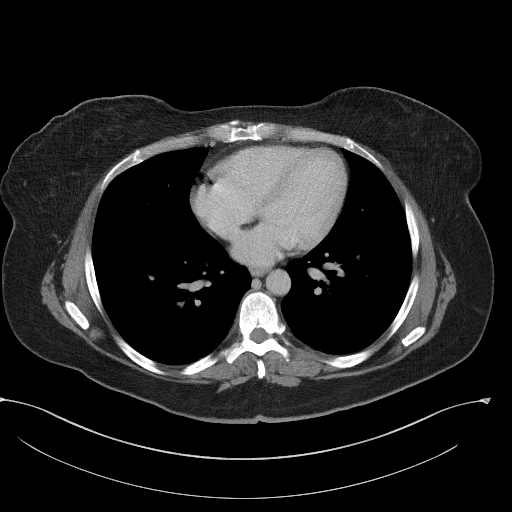

[Series 5: coronal st · coronal · 0.81mm/px · 3 of 101 slices shown]
[im 34/101  soft-tissue]
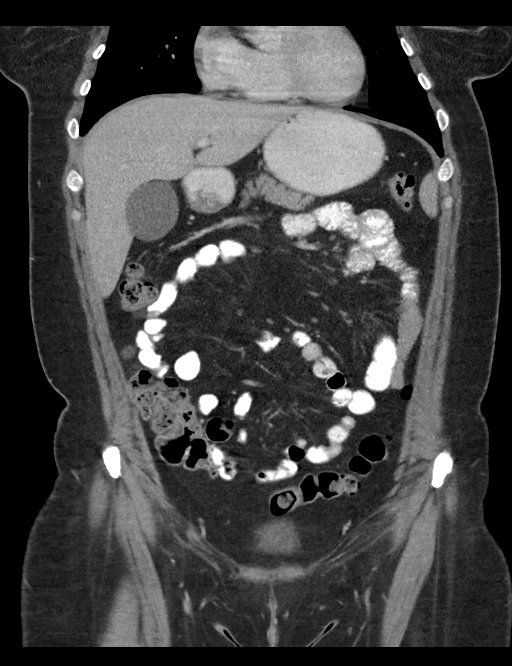
[im 45/101  soft-tissue]
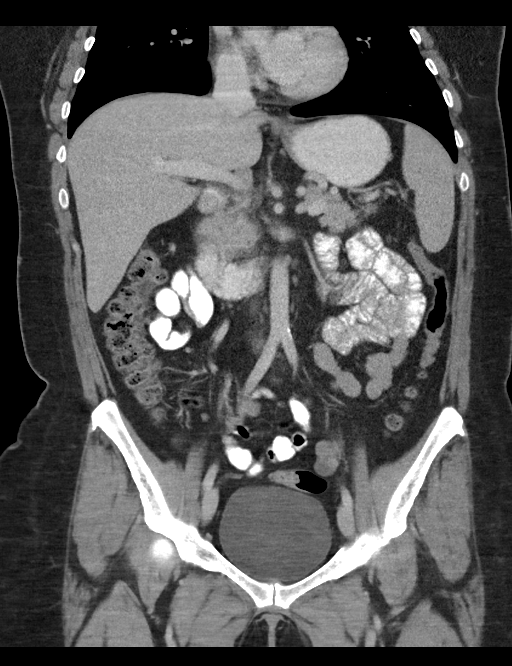
[im 56/101  soft-tissue]
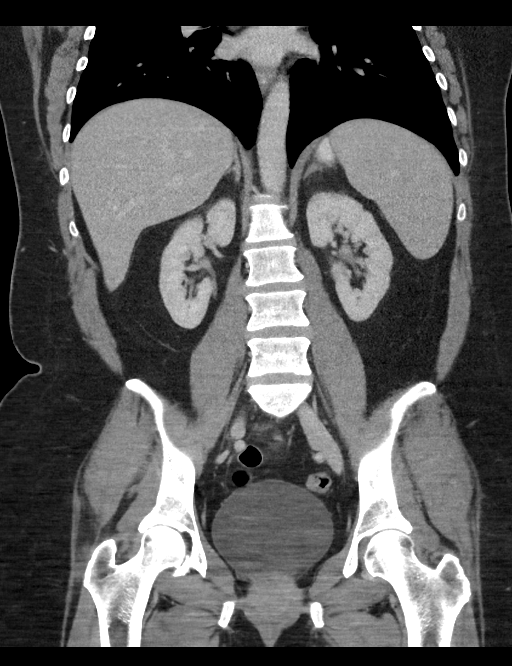

[16 of 46 positions shown; findings below may reference images not displayed]

FINDINGS: Lower chest: The visualized lung bases are grossly clear. The
visualized portions of the mediastinum are unremarkable.

Hepatobiliary: The liver is unremarkable in appearance. The
gallbladder is unremarkable in appearance. The common bile duct
remains normal in caliber.

Pancreas: The pancreas is within normal limits.

Spleen: The spleen is unremarkable in appearance.

Adrenals/Urinary Tract: The adrenal glands are unremarkable in
appearance. The kidneys are within normal limits. There is no
evidence of hydronephrosis. No renal or ureteral stones are
identified. No perinephric stranding is seen.

Stomach/Bowel: The stomach is unremarkable in appearance. The small
bowel is within normal limits. The appendix is normal in caliber,
without evidence of appendicitis. The colon is unremarkable in
appearance.

Vascular/Lymphatic: Minimal calcification is noted along the left
common iliac artery. No retroperitoneal or pelvic sidewall
lymphadenopathy is seen.

Reproductive: The bladder is mildly distended and grossly
unremarkable. The patient is status post hysterectomy. No suspicious
adnexal masses are seen. The ovaries are unremarkable in appearance.

Other: No additional soft tissue abnormalities are seen.

Musculoskeletal: No acute osseous abnormalities are identified. The
visualized musculature is unremarkable in appearance.
IMPRESSION: Unremarkable contrast-enhanced CT of the abdomen and pelvis.

## 2022-03-11 ENCOUNTER — Ambulatory Visit (INDEPENDENT_AMBULATORY_CARE_PROVIDER_SITE_OTHER): Payer: BC Managed Care – PPO

## 2022-03-11 ENCOUNTER — Ambulatory Visit: Payer: BC Managed Care – PPO | Admitting: Sports Medicine

## 2022-03-11 DIAGNOSIS — M67442 Ganglion, left hand: Secondary | ICD-10-CM

## 2022-03-11 DIAGNOSIS — M25542 Pain in joints of left hand: Secondary | ICD-10-CM | POA: Diagnosis not present

## 2022-03-11 MED ORDER — MELOXICAM 15 MG PO TABS: ORAL_TABLET | ORAL | 3 refills | Status: DC

## 2022-03-11 NOTE — Assessment & Plan Note (Signed)
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 Mildly painful. Adding home conditioning, x-rays, meloxicam, return to see me in 2 to 4 weeks, we will incise and express the contents if not better.

## 2022-03-11 NOTE — Progress Notes (Signed)
    Procedures performed today:    None.  Independent interpretation of notes and tests performed by another provider:   None.  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 Mildly painful. Adding home conditioning, x-rays, meloxicam, return to see me in 2 to 4 weeks, we will incise and express the contents if not better.  Chronic process with exacerbation and pharmacologic intervention  ___________________________________________ Ihor Austin. Benjamin Stain, M.D., ABFM., CAQSM. Primary Care and Sports Medicine  [facility]  Adjunct Instructor of Family Medicine  [employer]

## 2022-04-08 ENCOUNTER — Ambulatory Visit: Payer: BC Managed Care – PPO | Admitting: Sports Medicine

## 2022-04-10 ENCOUNTER — Ambulatory Visit: Payer: BC Managed Care – PPO | Admitting: Sports Medicine

## 2022-04-10 DIAGNOSIS — M67442 Ganglion, left hand: Secondary | ICD-10-CM

## 2022-04-10 NOTE — Progress Notes (Signed)
    Procedures performed today:    Simple incision digital mucinous cyst Risks, benefits, and alternatives explained and consent obtained. Time out conducted. Surface cleaned with alcohol. Topical cooling spray used for anesthesia. Area prepped and draped in a sterile fashion. 18-gauge needle used to make a stab incision into the digital mucinous cyst Cyst contents expressed with pressure. Pt stable. Aftercare and follow-up advised.  Independent interpretation of notes and tests performed by another provider:   None.  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 She will return in 4 weeks as needed. She does understand the relatively high recurrence rate.    ____________________________________________ Ihor Austin. Benjamin Stain, M.D., ABFM., CAQSM., AME. Primary Care and Sports Medicine [place] [facility]  Adjunct Professor of Family Medicine  [employer]  Restaurant manager, fast food

## 2022-04-10 NOTE — Assessment & Plan Note (Signed)
We did an incision and expression of the contents of a left third DIP mucinous cyst. She will return in 4 weeks as needed. She does understand the relatively high recurrence rate.

## 2022-05-13 ENCOUNTER — Ambulatory Visit: Admit: 2022-05-13 | Discharge status: Absent without leave | Payer: BC Managed Care – PPO

## 2022-05-13 ENCOUNTER — Ambulatory Visit (INDEPENDENT_AMBULATORY_CARE_PROVIDER_SITE_OTHER): Payer: BC Managed Care – PPO

## 2022-05-13 ENCOUNTER — Ambulatory Visit
Admission: EM | Admit: 2022-05-13 | Discharge: 2022-05-13 | Disposition: A | Discharge status: Home / Self Care | Payer: BC Managed Care – PPO

## 2022-05-13 DIAGNOSIS — M25512 Pain in left shoulder: Secondary | ICD-10-CM | POA: Diagnosis not present

## 2022-05-13 DIAGNOSIS — M7502 Adhesive capsulitis of left shoulder: Secondary | ICD-10-CM | POA: Diagnosis not present

## 2022-05-13 MED ORDER — METHYLPREDNISOLONE SODIUM SUCC 125 MG IJ SOLR
125.0000 mg | Freq: Once | INTRAMUSCULAR | Status: AC
  Administered 2022-05-13: 125 mg via INTRAMUSCULAR

## 2022-05-13 MED ORDER — KETOROLAC TROMETHAMINE 60 MG/2ML IM SOLN
60.0000 mg | Freq: Once | INTRAMUSCULAR | Status: AC
  Administered 2022-05-13: 60 mg via INTRAMUSCULAR

## 2022-05-13 MED ORDER — METHOCARBAMOL 500 MG PO TABS: 500.0000 mg | ORAL_TABLET | Freq: Three times a day (TID) | ORAL | 0 refills | Status: DC | PRN

## 2022-05-13 MED ORDER — PREDNISONE 10 MG (21) PO TBPK: ORAL_TABLET | Freq: Every day | ORAL | 0 refills | Status: DC

## 2022-05-13 NOTE — ED Provider Notes (Addendum)
[facility]    CSN: [id number] Arrival date & time: 05/13/22  1225      History   Chief Complaint Chief Complaint  Patient presents with   Shoulder Pain    HPI Kerri Patterson is a 46 y.o. female.   HPI 46 year old female presents with left shoulder pain since last night after hugging her son, reports no other precipitating event.  PMH significant for obesity, dysmenorrhea, and thyroid disease.  Patient presents with no movement of left shoulder unable to move per patient.  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 03/11/2022   S/P laparoscopic assisted vaginal hysterectomy (LAVH) 11/29/2013   Dysfunctional uterine bleeding 11/28/2013   Dysmenorrhea 11/28/2013    Past Surgical History:  Procedure Laterality Date   BILATERAL SALPINGECTOMY Bilateral 11/29/2013   Procedure: BILATERAL SALPINGECTOMY;  Surgeon: Sherron Monday, MD;  Location: [facility];  Service: Gynecology;  Laterality: Bilateral;   Invitro fertilization     LAPAROSCOPIC ASSISTED VAGINAL HYSTERECTOMY N/A 11/29/2013   Procedure: LAPAROSCOPIC ASSISTED VAGINAL HYSTERECTOMY;  Surgeon: Sherron Monday, MD;  Location: [facility];  Service: Gynecology;  Laterality: N/A;    OB History   No obstetric history on file.      Home Medications    Prior to Admission medications   Medication Sig Start Date End Date Taking? Authorizing Provider  methocarbamol (ROBAXIN) 500 MG tablet Take 1 tablet (500 mg total) by mouth 3 (three) times daily as needed for muscle spasms. 05/13/22  Yes Trevor Iha, FNP  predniSONE (STERAPRED UNI-PAK 21 TAB) 10 MG (21) TBPK tablet  Take by mouth daily. Take 6 tabs by mouth daily  for 2 days, then 5 tabs for 2 days, then 4 tabs for 2 days, then 3 tabs for 2 days, 2 tabs for 2 days, then 1 tab by mouth daily for 2 days 05/13/22  Yes Trevor Iha, FNP  Levothyroxine Sodium (SYNTHROID PO) Take by mouth.    [provider]  liothyronine (CYTOMEL) 5 MCG tablet Take 5 mcg by mouth daily. 02/27/22   [provider]  losartan (COZAAR) 25 MG tablet TAKE 1 TABLET BY MOUTH EVERY DAY IN THE MORNING    [provider]  Magnesium 200 MG TABS Take 2 tablets by mouth at bedtime. Magnesium Malate 1300 mg (200 mg magnesium)    [provider]  meloxicam (MOBIC) 15 MG tablet One tab PO every 24 hours with a meal for 2 weeks, then once every 24 hours prn pain. 03/11/22   Monica Becton, MD  OVER THE COUNTER MEDICATION Take 1 tablet by mouth daily. Vitamin D3 5000 IU + Vitamin K 1100 mcg + iodide 1000 mcg / tablet    [provider]    Family History Family History  Problem Relation Age of Onset   Stroke Father     Social History Social History   Tobacco Use   Smoking status: Never   Smokeless tobacco: Never  Vaping Use   Vaping Use: Never used  Substance Use Topics   Alcohol use: Yes    Comment: very rare   Drug use: No  Allergies   Patient has no known allergies.   Review of Systems Review of Systems  Musculoskeletal:        Left shoulder pain unable to move since last night.     Physical Exam Triage Vital Signs ED Triage Vitals  Enc Vitals Group     BP 05/13/22 1304 (!) 147/96     Pulse Rate 05/13/22 1305 (!) 101     Resp 05/13/22 1305 20     Temp 05/13/22 1305 98.7 F (37.1 C)     Temp Source 05/13/22 1305 Oral     SpO2 05/13/22 1305 93 %     Weight 05/13/22 1258 230 lb (104.3 kg)     Height 05/13/22 1258 5\' 6"  (1.676 m)     Head Circumference --      Peak Flow --      Pain Score 05/13/22 1258 4     Pain Loc --      Pain Edu? --      Excl. in GC? --     No data found.  Updated Vital Signs BP (!) 143/95 (BP Location: Right Arm)   Pulse (!) 101   Temp 98.7 F (37.1 C) (Oral)   Resp 20   Ht 5\' 6"  (1.676 m)   Wt 230 lb (104.3 kg)   LMP 11/12/2013   SpO2 93%   BMI 37.12 kg/m      Physical Exam Vitals and nursing note reviewed.  Constitutional:      Appearance: Normal appearance. She is obese.  HENT:     Head: Normocephalic and atraumatic.     Mouth/Throat:     Mouth: Mucous membranes are moist.     Pharynx: Oropharynx is clear.  Eyes:     Extraocular Movements: Extraocular movements intact.     Conjunctiva/sclera: Conjunctivae normal.     Pupils: Pupils are equal, round, and reactive to light.  Cardiovascular:     Rate and Rhythm: Normal rate and regular rhythm.     Pulses: Normal pulses.     Heart sounds: Normal heart sounds.  Pulmonary:     Effort: Pulmonary effort is normal.     Breath sounds: Normal breath sounds. No wheezing, rhonchi or rales.  Musculoskeletal:        General: Normal range of motion.     Cervical back: Normal range of motion and neck supple.     Comments: Left shoulder: TTP over GH, unable to lift left shoulder  Skin:    General: Skin is warm and dry.  Neurological:     General: No focal deficit present.     Mental Status: She is alert and oriented to person, place, and time. Mental status is at baseline.      UC Treatments / Results  Labs (all labs ordered are listed, but only abnormal results are displayed) Labs Reviewed - No data to display  EKG   Radiology DG Shoulder Left  Result Date: 05/13/2022 CLINICAL DATA:  Sudden onset left shoulder pain last night. No injury. EXAM: LEFT SHOULDER - 2+ VIEW COMPARISON:  None Available. FINDINGS: No acute fracture or dislocation. Joint spaces are preserved. Bone mineralization is normal. Faint calcification superior to the greater tuberosity on the Grashey view. IMPRESSION: 1. Faint calcification superior to the greater tuberosity on the  Grashey view may reflect calcific tendinitis. 2. No acute osseous abnormality or significant degenerative changes. Electronically Signed   By: 11/14/2013 M.D.   On: 05/13/2022 13:31    Procedures  Procedures (including critical care time)  Medications Ordered in UC Medications  methylPREDNISolone sodium succinate (SOLU-MEDROL) 125 mg/2 mL injection 125 mg (125 mg Intramuscular Given 05/13/22 1403)  ketorolac (TORADOL) injection 60 mg (60 mg Intramuscular Given 05/13/22 1403)    Initial Impression / Assessment and Plan / UC Course  I have reviewed the triage vital signs and the nursing notes.  Pertinent labs & imaging results that were available during my care of the patient were reviewed by me and considered in my medical decision making (see chart for details).     MDM: 1.  Adhesive capsulitis of left shoulder-IM Solu-Medrol 125 mg, IM Toradol 60 mg given in clinic prior to discharge, Rx'd Sterapred Unipak, Robaxin; 2.  Acute pain of left shoulder-left shoulder x-ray reveals above. Patient to take medication as directed with food to completion.  Advised patient to start Cooper Render tomorrow morning Wednesday, 05/14/2022.  Encouraged patient to increase daily water intake while taking these medications.  Advised patient may take Robaxin daily or as needed for accompanying muscle spasms of left shoulder.  Advised patient if symptoms worsen and/or unresolved please follow-up with [facility] health orthopedic provider for further evaluation.  Contact information for this provider is below.  Patient discharged home, hemodynamically stable. Final Clinical Impressions(s) / UC Diagnoses   Final diagnoses:  Acute pain of left shoulder  Adhesive capsulitis of left shoulder     Discharge Instructions      Patient to take medication as directed with food to completion.  Advised patient to start Cooper Render tomorrow morning Wednesday, 05/14/2022.  Encouraged patient to increase daily water  intake while taking these medications.  Advised patient may take Robaxin daily or as needed for accompanying muscle spasms of left shoulder.  Advised patient if symptoms worsen and/or unresolved please follow-up with [facility] health orthopedic provider for further evaluation.  Contact information for this provider is below.     ED Prescriptions     Medication Sig Dispense Auth. Provider   predniSONE (STERAPRED UNI-PAK 21 TAB) 10 MG (21) TBPK tablet Take by mouth daily. Take 6 tabs by mouth daily  for 2 days, then 5 tabs for 2 days, then 4 tabs for 2 days, then 3 tabs for 2 days, 2 tabs for 2 days, then 1 tab by mouth daily for 2 days 42 tablet Trevor Iha, FNP   methocarbamol (ROBAXIN) 500 MG tablet Take 1 tablet (500 mg total) by mouth 3 (three) times daily as needed for muscle spasms. 30 tablet Trevor Iha, FNP      PDMP not reviewed this encounter.   Trevor Iha, FNP 05/13/22 1403    Trevor Iha, FNP 05/13/22 1430

## 2022-05-13 NOTE — ED Triage Notes (Signed)
Pt presents to Urgent Care with c/o L shoulder pain since last night after hugging her son--no other precipitating event.

## 2022-05-13 NOTE — Discharge Instructions (Addendum)
Patient to take medication as directed with food to completion.  Advised patient to start Cooper Render tomorrow morning Wednesday, 05/14/2022.  Encouraged patient to increase daily water intake while taking these medications.  Advised patient may take Robaxin daily or as needed for accompanying muscle spasms of left shoulder.  Advised patient if symptoms worsen and/or unresolved please follow-up with [facility] health orthopedic provider for further evaluation.  Contact information for this provider is below.

## 2022-05-15 ENCOUNTER — Ambulatory Visit: Payer: BC Managed Care – PPO | Admitting: Sports Medicine

## 2022-05-16 ENCOUNTER — Ambulatory Visit: Payer: BC Managed Care – PPO | Admitting: Sports Medicine

## 2022-05-19 ENCOUNTER — Ambulatory Visit: Payer: Self-pay

## 2022-05-19 ENCOUNTER — Ambulatory Visit: Payer: BC Managed Care – PPO | Admitting: Family Medicine

## 2022-05-19 ENCOUNTER — Encounter: Payer: Self-pay | Admitting: Family Medicine

## 2022-05-19 VITALS — BP 140/100 | Ht 66.0 in | Wt 230.0 lb

## 2022-05-19 DIAGNOSIS — M65912 Unspecified synovitis and tenosynovitis, left shoulder: Secondary | ICD-10-CM | POA: Insufficient documentation

## 2022-05-19 DIAGNOSIS — M65812 Other synovitis and tenosynovitis, left shoulder: Secondary | ICD-10-CM | POA: Diagnosis not present

## 2022-05-19 MED ORDER — METHYLPREDNISOLONE ACETATE 40 MG/ML IJ SUSP
40.0000 mg | Freq: Once | INTRAMUSCULAR | Status: AC
  Administered 2022-05-19: 40 mg via INTRA_ARTICULAR

## 2022-05-19 NOTE — Progress Notes (Signed)
  Kerri Patterson - 46 y.o. female MRN [id number]  Date of birth: 1976-05-01  SUBJECTIVE:  Including CC & ROS.  No chief complaint on file.   Kerri Patterson is a 46 y.o. female that is presenting with acute left shoulder pain.  No injury or inciting event.  No history of similar pain.  She did get improvement with the steroid but this pain is closely returned.  Having pain that is severe and every range of motion.  Review of the urgent care note from 8/22 shows she was provided prednisone and Robaxin. Independent review of the left shoulder x-ray from 8/22 shows no acute changes  Review of Systems See HPI   HISTORY: Past Medical, Surgical, Social, and Family History Reviewed & Updated per EMR.   Pertinent Historical Findings include:  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/29/2013   Thyroid disease     Past Surgical History:  Procedure Laterality Date   BILATERAL SALPINGECTOMY Bilateral 11/29/2013   Procedure: BILATERAL SALPINGECTOMY;  Surgeon: Sherron Monday, MD;  Location: [facility];  Service: Gynecology;  Laterality: Bilateral;   Invitro fertilization     LAPAROSCOPIC ASSISTED VAGINAL HYSTERECTOMY N/A 11/29/2013   Procedure: LAPAROSCOPIC ASSISTED VAGINAL HYSTERECTOMY;  Surgeon: Sherron Monday, MD;  Location: [facility];  Service: Gynecology;  Laterality: N/A;     PHYSICAL EXAM:  VS: BP (!) 140/100 (BP Location: Right Arm, Patient Position: Sitting)   Ht 5\' 6"  (1.676 m)   Wt 230 lb (104.3 kg)   LMP 11/12/2013   BMI 37.12 kg/m  Physical Exam Gen: NAD, alert, cooperative with exam, well-appearing MSK:  Neurovascularly intact    Limited ultrasound: Left shoulder:  There is thickening overlying the anterior portion of the capsule and overlying of the  Biceps Tendon. There Is an Effusion Overlying the Subscapularis  Summary: Findings consistent with synovitis.  Ultrasound and interpretation by 11/14/2013, MD   Aspiration/Injection Procedure Note [facility] 11-Aug-1976  Procedure: Injection Indications: Left shoulder pain  Procedure Details Consent: Risks of procedure as well as the alternatives and risks of each were explained to the (patient/caregiver).  Consent for procedure obtained. Time Out: Verified patient identification, verified procedure, site/side was marked, verified correct patient position, special equipment/implants available, medications/allergies/relevent history reviewed, required imaging and test results available.  Performed.  The area was cleaned with iodine and alcohol swabs.    The left glenohumeral joint was injected using 3 cc of 1% lidocaine on a 22-gauge 3-1/2 inch needle.  The syringe was switched to mixture containing 1 cc's of 40 mg Kenalog and 4 cc's of 0.25% bupivacaine was injected.  Ultrasound was used. Images were obtained in short views showing the injection.     A sterile dressing was applied.  Patient did tolerate procedure well.     ASSESSMENT & PLAN:   Synovitis of left shoulder Acutely occurring.  Symptoms most consistent with a synovitis given the changes on imaging. -Counseled on home exercise therapy and supportive care. -X-ray today. -Could consider lab work or further imaging.

## 2022-05-19 NOTE — Patient Instructions (Signed)
Nice to meet you Please try heat  Please try the exercises   Please send me a message in MyChart with any questions or updates.  Please see Dr. Karie Schwalbe or myself back in 2-3 weeks if no better.   --Dr. Jordan Likes

## 2022-05-19 NOTE — Assessment & Plan Note (Signed)
Acutely occurring.  Symptoms most consistent with a synovitis given the changes on imaging. -Counseled on home exercise therapy and supportive care. -X-ray today. -Could consider lab work or further imaging.

## 2022-05-22 ENCOUNTER — Encounter: Payer: Self-pay | Admitting: Family Medicine

## 2022-05-22 ENCOUNTER — Other Ambulatory Visit: Payer: Self-pay | Admitting: Family Medicine

## 2022-05-22 DIAGNOSIS — M65812 Other synovitis and tenosynovitis, left shoulder: Secondary | ICD-10-CM

## 2022-05-22 MED ORDER — HYDROCODONE-ACETAMINOPHEN 5-325 MG PO TABS: 1.0000 | ORAL_TABLET | Freq: Three times a day (TID) | ORAL | 0 refills | Status: DC | PRN

## 2022-05-22 NOTE — Progress Notes (Signed)
Check lab work and provide norco for severe pain.   Myra Rude, MD [facility] 05/22/2022, 8:31 AM

## 2022-05-29 ENCOUNTER — Telehealth: Payer: Self-pay | Admitting: Family Medicine

## 2022-05-29 LAB — COMPREHENSIVE METABOLIC PANEL
ALT: 9 IU/L (ref 0–32)
AST: 9 IU/L (ref 0–40)
Albumin/Globulin Ratio: 1.9 (ref 1.2–2.2)
Albumin: 4.4 g/dL (ref 3.9–4.9)
Alkaline Phosphatase: 78 IU/L (ref 44–121)
BUN/Creatinine Ratio: 14 (ref 9–23)
BUN: 12 mg/dL (ref 6–24)
Bilirubin Total: 0.5 mg/dL (ref 0.0–1.2)
CO2: 25 mmol/L (ref 20–29)
Calcium: 9.8 mg/dL (ref 8.7–10.2)
Chloride: 101 mmol/L (ref 96–106)
Creatinine, Ser: 0.83 mg/dL (ref 0.57–1.00)
Globulin, Total: 2.3 g/dL (ref 1.5–4.5)
Glucose: 85 mg/dL (ref 70–99)
Potassium: 4.8 mmol/L (ref 3.5–5.2)
Sodium: 139 mmol/L (ref 134–144)
Total Protein: 6.7 g/dL (ref 6.0–8.5)
eGFR: 87 mL/min/{1.73_m2} (ref >59)

## 2022-05-29 LAB — CBC
Hematocrit: 44.1 % (ref 34.0–46.6)
Hemoglobin: 14.3 g/dL (ref 11.1–15.9)
MCH: 27.1 pg (ref 26.6–33.0)
MCHC: 32.4 g/dL (ref 31.5–35.7)
MCV: 84 fL (ref 79–97)
Platelets: 250 10*3/uL (ref 150–450)
RBC: 5.27 x10E6/uL (ref 3.77–5.28)
RDW: 13.1 % (ref 11.7–15.4)
WBC: 10.6 10*3/uL (ref 3.4–10.8)

## 2022-05-29 LAB — ANA,IFA RA DIAG PNL W/RFLX TIT/PATN
ANA Titer 1: NEGATIVE
Cyclic Citrullin Peptide Ab: <1 units (ref 0–19)
Rhuematoid fact SerPl-aCnc: <10 IU/mL (ref <14.0)

## 2022-05-29 LAB — C-REACTIVE PROTEIN: CRP: 2 mg/L (ref 0–10)

## 2022-05-29 LAB — SEDIMENTATION RATE: Sed Rate: 4 mm/hr (ref 0–32)

## 2022-05-29 LAB — URIC ACID: Uric Acid: 4.7 mg/dL (ref 2.6–6.2)

## 2022-05-29 NOTE — Telephone Encounter (Signed)
Informed of results.   Myra Rude, MD [facility] 05/29/2022, 1:39 PM

## 2022-11-23 ENCOUNTER — Ambulatory Visit
Admission: RE | Admit: 2022-11-23 | Discharge: 2022-11-23 | Disposition: A | Discharge status: Home / Self Care | Payer: 59 | Source: Ambulatory Visit | Attending: Family Medicine | Admitting: Family Medicine

## 2022-11-23 ENCOUNTER — Ambulatory Visit (INDEPENDENT_AMBULATORY_CARE_PROVIDER_SITE_OTHER): Payer: 59

## 2022-11-23 VITALS — BP 146/95 | HR 87 | Temp 99.3°F | Resp 18 | Ht 66.0 in | Wt 240.0 lb

## 2022-11-23 DIAGNOSIS — J181 Lobar pneumonia, unspecified organism: Secondary | ICD-10-CM

## 2022-11-23 DIAGNOSIS — R058 Other specified cough: Secondary | ICD-10-CM | POA: Diagnosis not present

## 2022-11-23 MED ORDER — IPRATROPIUM BROMIDE 0.03 % NA SOLN: 2.0000 | Freq: Two times a day (BID) | NASAL | 0 refills | Status: DC

## 2022-11-23 MED ORDER — DM-GUAIFENESIN ER 30-600 MG PO TB12: 1.0000 | ORAL_TABLET | Freq: Two times a day (BID) | ORAL | 0 refills | Status: DC

## 2022-11-23 MED ORDER — BENZONATATE 200 MG PO CAPS: 200.0000 mg | ORAL_CAPSULE | Freq: Two times a day (BID) | ORAL | 0 refills | Status: DC | PRN

## 2022-11-23 MED ORDER — PREDNISONE 20 MG PO TABS: ORAL_TABLET | ORAL | 0 refills | Status: DC

## 2022-11-23 NOTE — ED Triage Notes (Signed)
Per pt, " Been coughing for 6 weeks. Visited a different urgent care last sunday but not improved after antibiotics" Previous  UC dx pt w/ pneumonia  - EKG , CXR, IM antibiotic, doxycyline for 7 days, albuterol, mucinex, sudafed, zofran Pt  rec'd a call on Wed from [place] stating she did not have pneumonia

## 2022-11-23 NOTE — Discharge Instructions (Signed)
Use Atrovent 2 times a day.  This will help reduce nasal congestion and postnasal drip Take prednisone as directed.  2 a day for 5 days, 1 a day for 5 days, then stop For cough take 1 Mucinex DM and 1 Tessalon every 12 hours Make sure you are drinking lots of water Run a humidifier in your bedroom if there is one available\ Call for problems

## 2022-11-23 NOTE — ED Provider Notes (Signed)
[name]    CSN: [id number] Arrival date & time: 11/23/22  0848      History   Chief Complaint Chief Complaint  Patient presents with   Cough    HPI Kerri Patterson is a 47 y.o. female.   HPI  Patient states she has been sick since end of January.  She started off with a "light cough" and what she thought was a cold.  Since then she states she has had a cough ever since.  She became worse last weekend and spiked a fever to 101.  Went to a different urgent care center.  She was seen and told she had pneumonia.  She was given a shot of antibiotics (presumably Rocephin) and then 7 days of doxycycline.  In spite of that she continues to cough.  Her chest hurts from the coughing.  She feels very tired.  She has a harsh cough that hurts when she coughs.  She does not have underlying lung disease.  She is not a smoker.  She is not prone to respiratory problems.  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/29/2013   Dysfunctional uterine bleeding 11/28/2013   Dysmenorrhea 11/28/2013    Past Surgical History:  Procedure Laterality Date   BILATERAL SALPINGECTOMY Bilateral 11/29/2013   Procedure: BILATERAL SALPINGECTOMY;  Surgeon: Thornell Sartorius, MD;  Location: [facility];  Service: Gynecology;  Laterality: Bilateral;   Invitro fertilization     LAPAROSCOPIC ASSISTED VAGINAL HYSTERECTOMY N/A 11/29/2013   Procedure: LAPAROSCOPIC ASSISTED VAGINAL HYSTERECTOMY;  Surgeon: Thornell Sartorius, MD;  Location: [facility];  Service:  Gynecology;  Laterality: N/A;    OB History   No obstetric history on file.      Home Medications    Prior to Admission medications   Medication Sig Start Date End Date Taking? Authorizing Provider  albuterol (VENTOLIN HFA) 108 (90 Base) MCG/ACT inhaler Inhale into the lungs. 11/16/22 11/16/23 Yes [provider]  benzonatate (TESSALON) 200 MG capsule Take 1 capsule (200 mg total) by mouth 2 (two) times daily as needed for cough. 11/23/22  Yes Raylene Everts, MD  dextromethorphan-guaiFENesin [facility] DM) 30-600 MG 12hr tablet Take 1 tablet by mouth 2 (two) times daily. 11/23/22  Yes Raylene Everts, MD  ipratropium (ATROVENT) 0.03 % nasal spray Place 2 sprays into both nostrils every 12 (twelve) hours. 11/23/22  Yes Raylene Everts, MD  predniSONE (DELTASONE) 20 MG tablet Take 2 pills a day for 5 days, then 1 pill a day for 5 days, then stop.  Take with food 11/23/22  Yes Raylene Everts, MD  Levothyroxine Sodium (SYNTHROID PO) Take by mouth.    [provider]  liothyronine (CYTOMEL) 5 MCG tablet Take 5 mcg by mouth daily. 02/27/22   [provider]  losartan (COZAAR) 25 MG tablet TAKE 1 TABLET BY MOUTH EVERY DAY IN THE MORNING    [provider]  Magnesium 200 MG TABS Take 2 tablets by mouth at bedtime. Magnesium Malate 1300 mg (200  mg magnesium)    [provider]  OVER THE COUNTER MEDICATION Take 1 tablet by mouth daily. Vitamin D3 5000 IU + Vitamin K 1100 mcg + iodide 1000 mcg / tablet    [provider]    Family History Family History  Problem Relation Age of Onset   Stroke Father     Social History Social History   Tobacco Use   Smoking status: Never   Smokeless tobacco: Never  Vaping Use   Vaping Use: Never used  Substance Use Topics   Alcohol use: Yes    Comment: very rare   Drug use: No     Allergies   Patient has no known allergies.   Review of Systems Review of Systems See HPI  Physical  Exam Triage Vital Signs ED Triage Vitals  Enc Vitals Group     BP 11/23/22 0911 (!) 146/95     Pulse Rate 11/23/22 0911 87     Resp 11/23/22 0911 18     Temp 11/23/22 0911 99.3 F (37.4 C)     Temp Source 11/23/22 0911 Oral     SpO2 11/23/22 0911 96 %     Weight 11/23/22 0913 240 lb (108.9 kg)     Height 11/23/22 0913 '5\' 6"'$  (1.676 m)     Head Circumference --      Peak Flow --      Pain Score 11/23/22 0912 4     Pain Loc --      Pain Edu? --      Excl. in [place]? --    No data found.  Updated Vital Signs BP (!) 146/95 (BP Location: Right Arm)   Pulse 87   Temp 99.3 F (37.4 C) (Oral)   Resp 18   Ht '5\' 6"'$  (1.676 m)   Wt 108.9 kg   LMP 11/12/2013   SpO2 96%   BMI 38.74 kg/m      Physical Exam Constitutional:      General: She is not in acute distress.    Appearance: She is well-developed. She is ill-appearing.  HENT:     Head: Normocephalic and atraumatic.  Eyes:     Conjunctiva/sclera: Conjunctivae normal.     Pupils: Pupils are equal, round, and reactive to light.  Cardiovascular:     Rate and Rhythm: Normal rate.     Heart sounds: Normal heart sounds.  Pulmonary:     Effort: Pulmonary effort is normal. No respiratory distress.     Breath sounds: Rhonchi present. No rales.     Comments: Few coarse rhonchi Abdominal:     General: There is no distension.     Palpations: Abdomen is soft.  Musculoskeletal:        General: Normal range of motion.     Cervical back: Normal range of motion.  Skin:    General: Skin is warm and dry.  Neurological:     Mental Status: She is alert.  Psychiatric:        Mood and Affect: Mood normal.        Behavior: Behavior normal.      UC Treatments / Results  Labs (all labs ordered are listed, but only abnormal results are displayed) Labs Reviewed - No data to display  EKG   Radiology DG Chest 2 View  Result Date: 11/23/2022 CLINICAL DATA:  Left lower lobe pneumonia. EXAM: CHEST - 2 VIEW COMPARISON:  June 06, 2009 chest x-ray FINDINGS: The heart size and mediastinal contours are  within normal limits. Both lungs are clear. The visualized skeletal structures are unremarkable. IMPRESSION: No active cardiopulmonary disease. Electronically Signed   By: Dorise Bullion III M.D.   On: 11/23/2022 09:57    Procedures Procedures (including critical care time)  Medications Ordered in UC Medications - No data to display  Initial Impression / Assessment and Plan / UC Course  I have reviewed the triage vital signs and the nursing notes.  Pertinent labs & imaging results that were available during my care of the patient were reviewed by me and considered in my medical decision making (see chart for details).  Clinical Course as of 11/23/22 1419  Sun Nov 23, 2022  1010 DG Chest 2 View [YN]    Clinical Course User Index [YN] Raylene Everts, MD   Patient has had a course of antibiotics with no improvement.  I feel she has bronchial inflammation from her virus.  I do not think additional antibiotics will help.  Give her Atrovent for her postnasal drip, cough management, prednisone. Final Clinical Impressions(s) / UC Diagnoses   Final diagnoses:  Post-viral cough syndrome     Discharge Instructions      Use Atrovent 2 times a day.  This will help reduce nasal congestion and postnasal drip Take prednisone as directed.  2 a day for 5 days, 1 a day for 5 days, then stop For cough take 1 Mucinex DM and 1 Tessalon every 12 hours Make sure you are drinking lots of water Run a humidifier in your bedroom if there is one available\for problems      ED Prescriptions     Medication Sig Dispense Auth. Provider   ipratropium (ATROVENT) 0.03 % nasal spray Place 2 sprays into both nostrils every 12 (twelve) hours. 30 mL Raylene Everts, MD   benzonatate (TESSALON) 200 MG capsule Take 1 capsule (200 mg total) by mouth 2 (two) times daily as needed for cough. 20 capsule Raylene Everts, MD    dextromethorphan-guaiFENesin [facility] DM) 30-600 MG 12hr tablet Take 1 tablet by mouth 2 (two) times daily. 20 tablet Raylene Everts, MD   predniSONE (DELTASONE) 20 MG tablet Take 2 pills a day for 5 days, then 1 pill a day for 5 days, then stop.  Take with food 15 tablet Raylene Everts, MD      PDMP not reviewed this encounter.   Raylene Everts, MD 11/23/22 1420

## 2023-01-05 ENCOUNTER — Encounter: Payer: Self-pay | Admitting: *Deleted

## 2023-01-19 IMAGING — DX DG HAND COMPLETE 3+V*L*
3 series · 3 of 3 positions shown · non-contrast
Comparison: None Available.

CLINICAL DATA: Pain and swelling of left third DIP joint. Long
history of what appears to be a digital mucinous cyst of left third
DIP. Mildly painful.

EXAM:
LEFT HAND - COMPLETE 3+ VIEW

[hand pa]
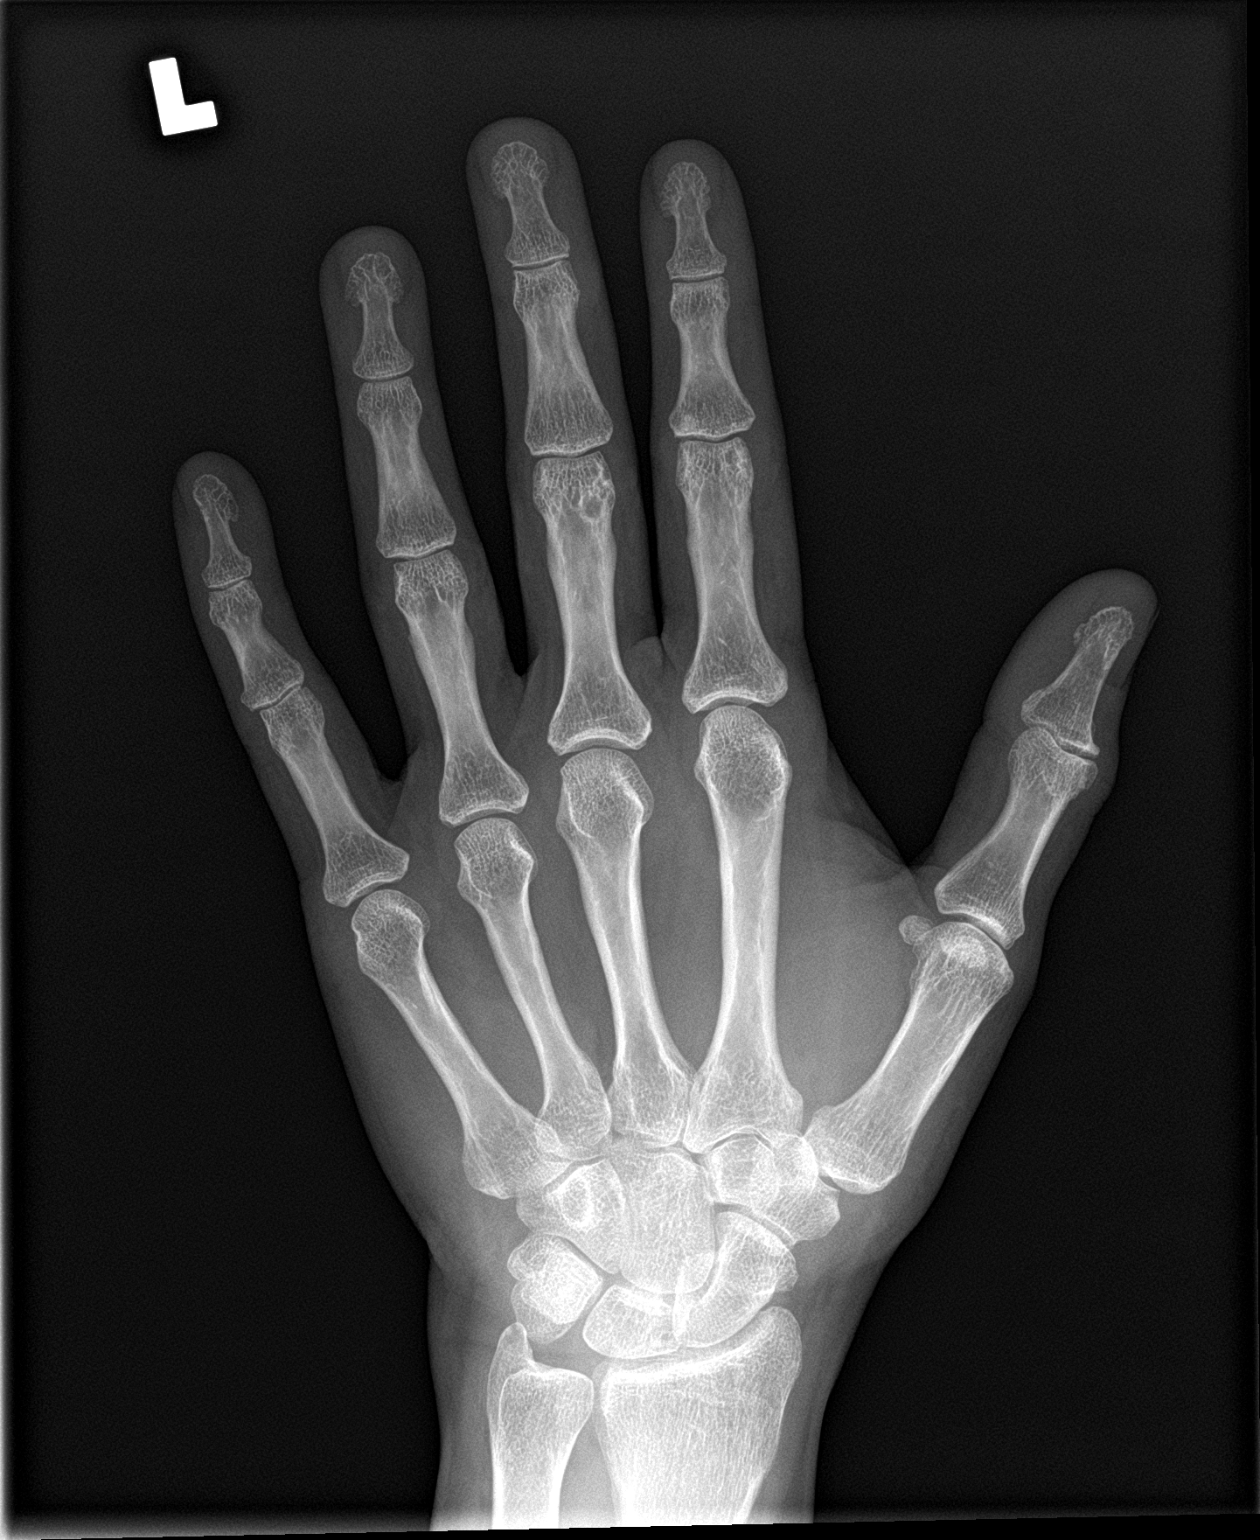

[hand obl]
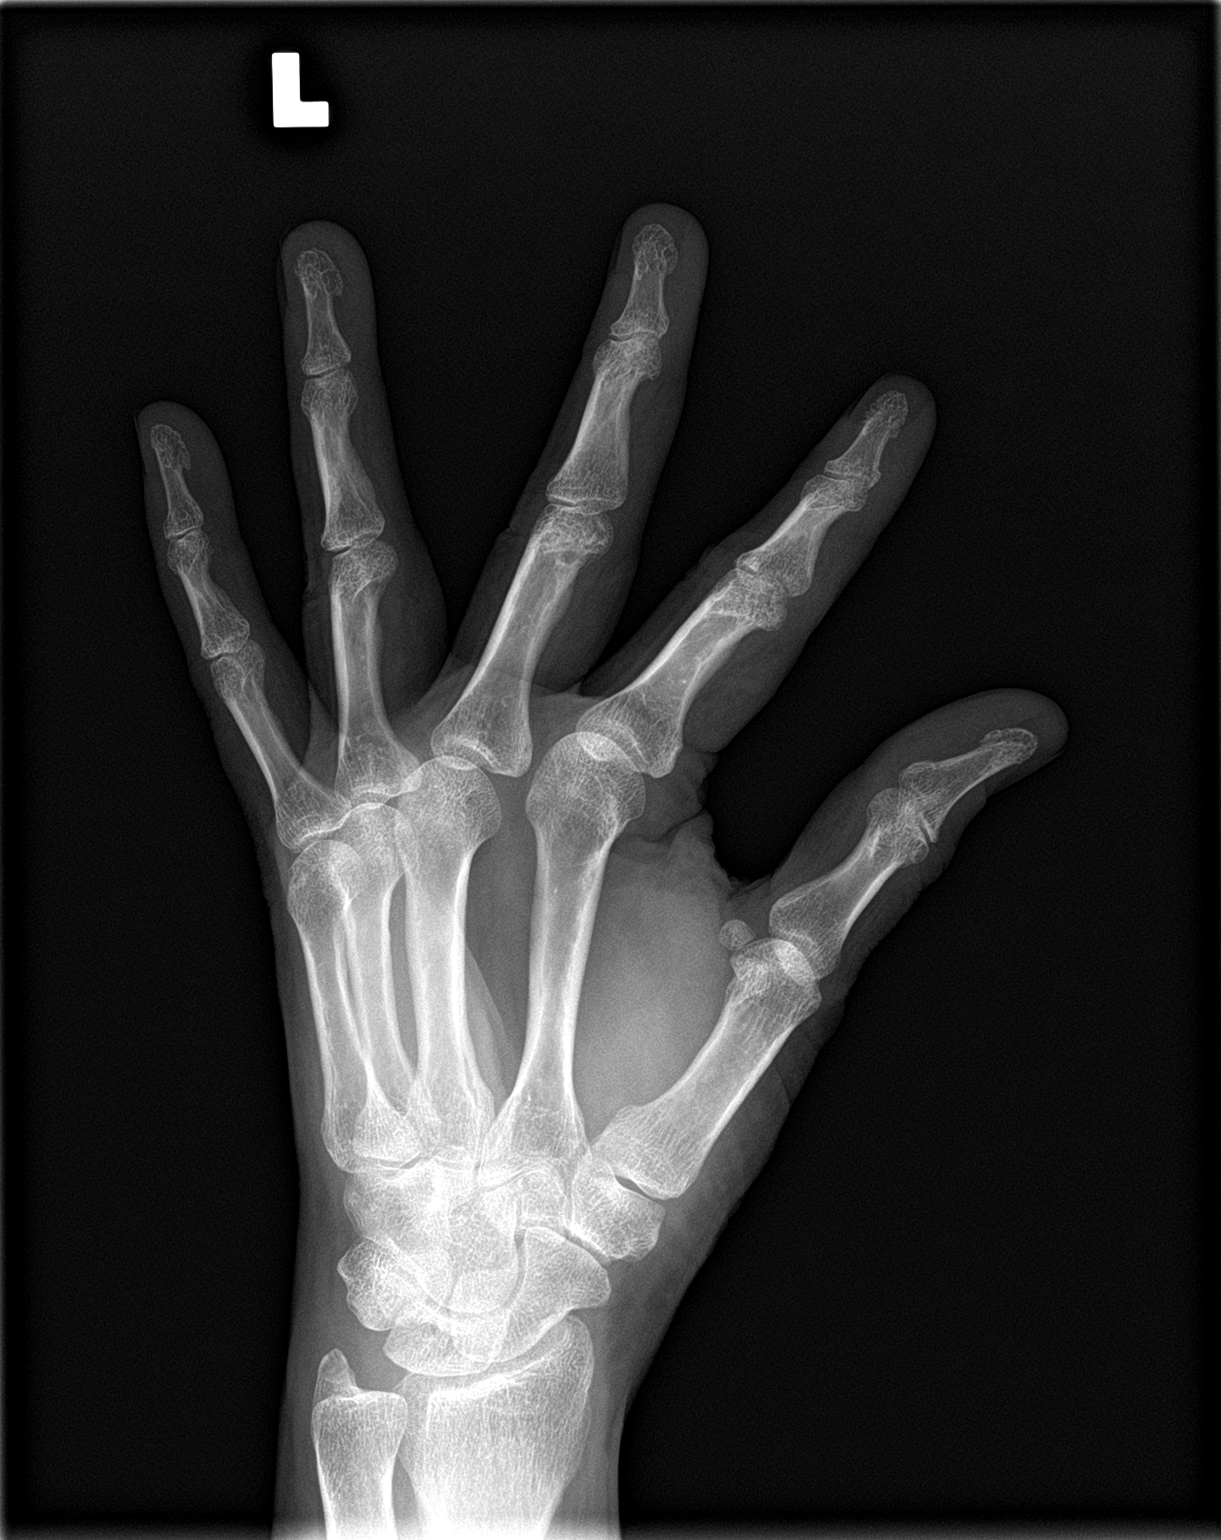

[hand lat]
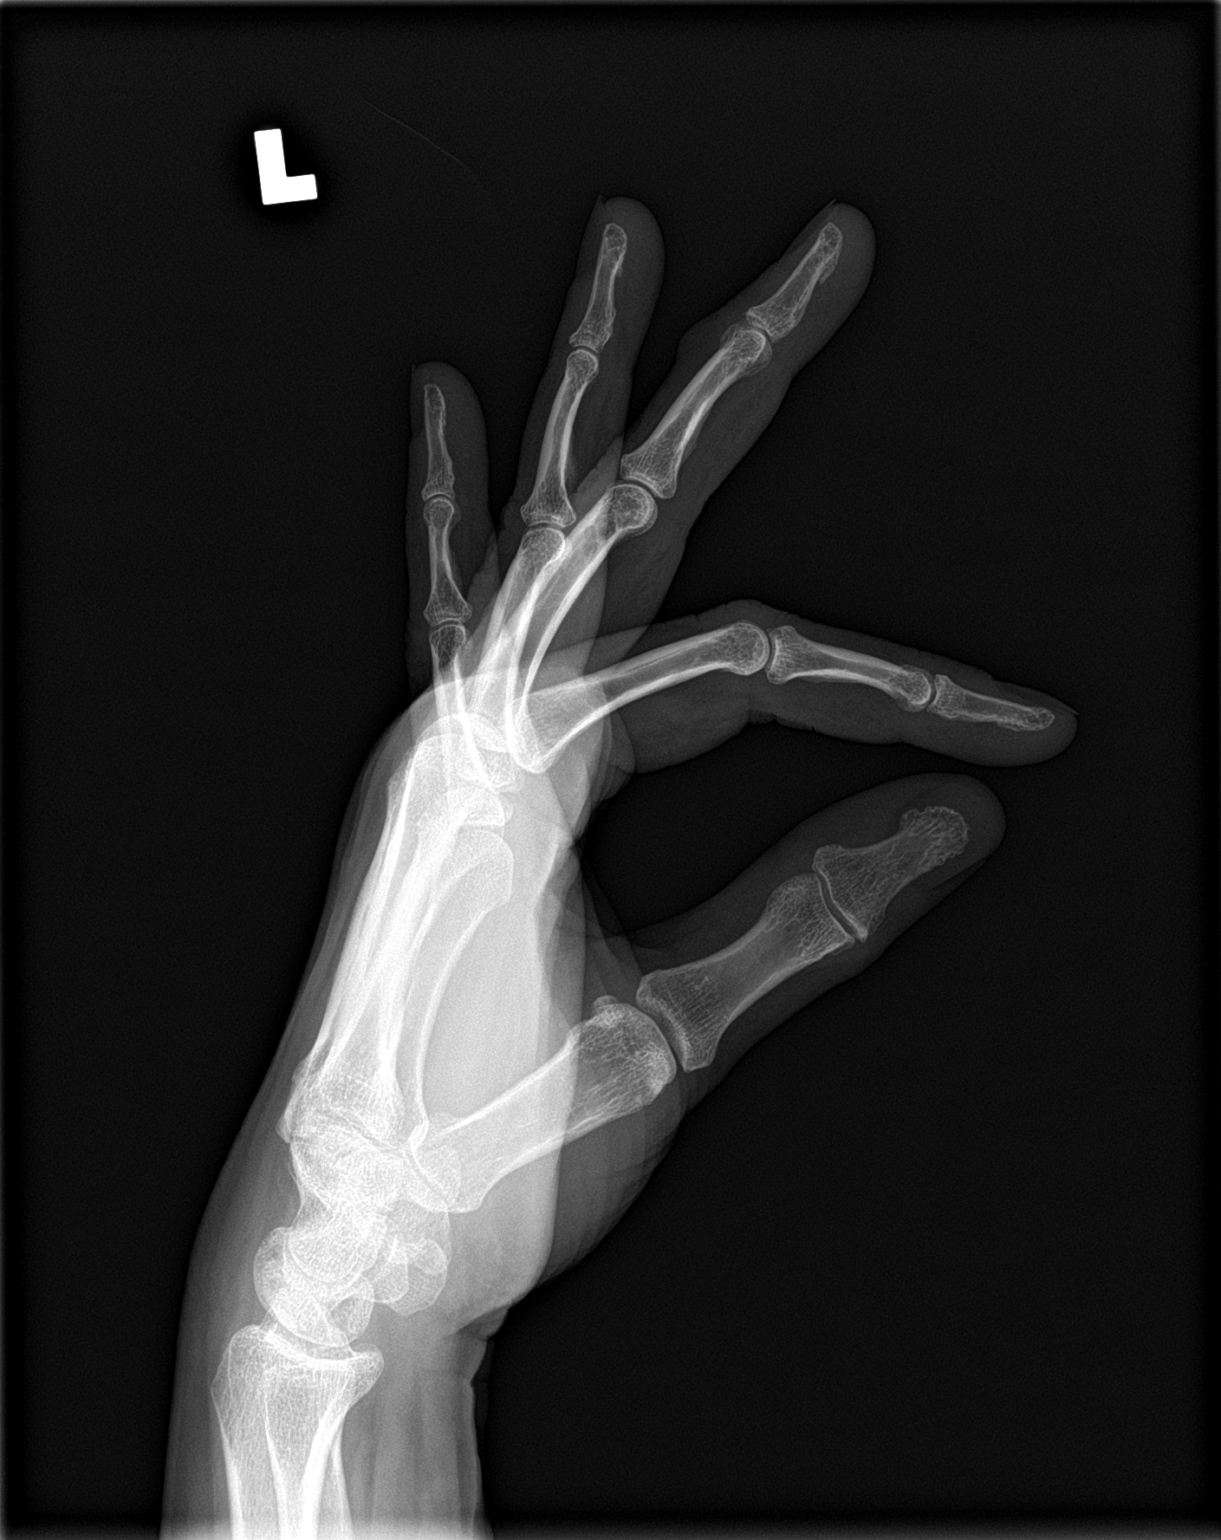

[3 of 3 positions shown; findings below may reference images not displayed]

FINDINGS: Normal bone mineralization. Minimal triscaphe and thumb
carpometacarpal joint space narrowing. Mild smooth round convexity
of the soft tissue contour of the distal third finger dorsal to the
distal shaft of the middle phalanx. No definite adjacent cortical
erosion. Joint spaces are preserved. No acute fracture or
dislocation.
IMPRESSION: Smooth soft tissue convexity likely corresponding to the clinically
described cystic structure at the dorsal aspect of the distal
portion of third finger middle phalanx.

No significant bone abnormality is seen.

## 2023-04-24 ENCOUNTER — Ambulatory Visit: Payer: 59 | Admitting: Sports Medicine

## 2023-04-24 ENCOUNTER — Ambulatory Visit (INDEPENDENT_AMBULATORY_CARE_PROVIDER_SITE_OTHER): Payer: 59

## 2023-04-24 DIAGNOSIS — M170 Bilateral primary osteoarthritis of knee: Secondary | ICD-10-CM | POA: Diagnosis not present

## 2023-04-24 DIAGNOSIS — M1710 Unilateral primary osteoarthritis, unspecified knee: Secondary | ICD-10-CM | POA: Diagnosis not present

## 2023-04-24 MED ORDER — MELOXICAM 15 MG PO TABS: ORAL_TABLET | ORAL | 3 refills | Status: DC

## 2023-04-24 MED ORDER — DICLOFENAC SODIUM 1 % EX GEL: 4.0000 g | Freq: Four times a day (QID) | CUTANEOUS | 11 refills | Status: AC

## 2023-04-24 NOTE — Assessment & Plan Note (Signed)
Very pleasant 47 year old female, she is a long history of bilateral knee pain right worse than left, she has seen an orthopedist in the past, diagnosed with patellofemoral arthritis, she had some injections that seem to work well for several years, recently she is having increasing pain, bilateral crepitus, worse with knee bending. On exam she has crepitus, tenderness at the patellar facets and minimally at the medial joint lines, no effusion. We will start conservatively with meloxicam, topical Voltaren, x-rays and home PT, return to see me in approximately 6 weeks, if she is not noticing improvement in 2 weeks she can come sooner for injections.

## 2023-04-24 NOTE — Progress Notes (Signed)
    Procedures performed today:    None.  Independent interpretation of notes and tests performed by another provider:   None.  Brief History, Exam, Impression, and Recommendations:    Patellofemoral arthritis Very pleasant 47 year old female, she is a long history of bilateral knee pain right worse than left, she has seen an orthopedist in the past, diagnosed with patellofemoral arthritis, she had some injections that seem to work well for several years, recently she is having increasing pain, bilateral crepitus, worse with knee bending. On exam she has crepitus, tenderness at the patellar facets and minimally at the medial joint lines, no effusion. We will start conservatively with meloxicam, topical Voltaren, x-rays and home PT, return to see me in approximately 6 weeks, if she is not noticing improvement in 2 weeks she can come sooner for injections.    ____________________________________________ Ihor Austin. Benjamin Stain, M.D., ABFM., CAQSM., AME. Primary Care and Sports Medicine [facility]  Adjunct Professor of Family Medicine  [place] [employer]  Restaurant manager, fast food

## 2023-06-04 ENCOUNTER — Ambulatory Visit: Payer: 59 | Admitting: Sports Medicine

## 2023-06-04 DIAGNOSIS — M1710 Unilateral primary osteoarthritis, unspecified knee: Secondary | ICD-10-CM

## 2023-06-04 MED ORDER — CELECOXIB 200 MG PO CAPS: ORAL_CAPSULE | ORAL | 2 refills | Status: DC

## 2023-06-04 NOTE — Progress Notes (Signed)
    Procedures performed today:    None.  Independent interpretation of notes and tests performed by another provider:   None.  Brief History, Exam, Impression, and Recommendations:    Patellofemoral arthritis Bilateral x-ray confirmed patellofemoral arthritis, only minimal efficacy with meloxicam, topical Voltaren. She will increase Voltaren to 3-4 times a day, switching from meloxicam to Celebrex as she was getting some dyspepsia. She will get more aggressive with a home physical therapy, I would like to see her back in a month and we can consider injections. We also discussed the importance of weight loss, she will consider establishing Care here, she is on high dose Wegovy without weight loss and I have suggested consideration of Zepbound.  I spent 30 minutes of total time managing this patient today, this includes chart review, face to face, and non-face to face time.  ____________________________________________ Ihor Austin. Benjamin Stain, M.D., ABFM., CAQSM., AME. Primary Care and Sports Medicine [facility]  Adjunct Professor of Family Medicine  [employer]  Restaurant manager, fast food

## 2023-06-04 NOTE — Assessment & Plan Note (Signed)
Bilateral x-ray confirmed patellofemoral arthritis, only minimal efficacy with meloxicam, topical Voltaren. She will increase Voltaren to 3-4 times a day, switching from meloxicam to Celebrex as she was getting some dyspepsia. She will get more aggressive with a home physical therapy, I would like to see her back in a month and we can consider injections. We also discussed the importance of weight loss, she will consider establishing Care here, she is on high dose Wegovy without weight loss and I have suggested consideration of Zepbound.

## 2023-07-28 ENCOUNTER — Emergency Department (HOSPITAL_BASED_OUTPATIENT_CLINIC_OR_DEPARTMENT_OTHER)
Admission: EM | Admit: 2023-07-28 | Discharge: 2023-07-28 | Disposition: A | Discharge status: Home / Self Care | Payer: 59 | Attending: Emergency Medicine | Admitting: Emergency Medicine

## 2023-07-28 ENCOUNTER — Encounter (HOSPITAL_BASED_OUTPATIENT_CLINIC_OR_DEPARTMENT_OTHER): Payer: Self-pay

## 2023-07-28 ENCOUNTER — Emergency Department (HOSPITAL_BASED_OUTPATIENT_CLINIC_OR_DEPARTMENT_OTHER): Payer: 59

## 2023-07-28 ENCOUNTER — Other Ambulatory Visit: Payer: Self-pay

## 2023-07-28 DIAGNOSIS — D72829 Elevated white blood cell count, unspecified: Secondary | ICD-10-CM | POA: Insufficient documentation

## 2023-07-28 DIAGNOSIS — Z79899 Other long term (current) drug therapy: Secondary | ICD-10-CM | POA: Diagnosis not present

## 2023-07-28 DIAGNOSIS — Z79890 Hormone replacement therapy (postmenopausal): Secondary | ICD-10-CM | POA: Diagnosis not present

## 2023-07-28 DIAGNOSIS — K529 Noninfective gastroenteritis and colitis, unspecified: Secondary | ICD-10-CM | POA: Diagnosis not present

## 2023-07-28 DIAGNOSIS — I10 Essential (primary) hypertension: Secondary | ICD-10-CM | POA: Insufficient documentation

## 2023-07-28 DIAGNOSIS — E039 Hypothyroidism, unspecified: Secondary | ICD-10-CM | POA: Insufficient documentation

## 2023-07-28 DIAGNOSIS — R1030 Lower abdominal pain, unspecified: Secondary | ICD-10-CM | POA: Diagnosis present

## 2023-07-28 DIAGNOSIS — R112 Nausea with vomiting, unspecified: Secondary | ICD-10-CM

## 2023-07-28 LAB — CBC WITH DIFFERENTIAL/PLATELET
Abs Immature Granulocytes: 0.05 10*3/uL (ref 0.00–0.07)
Basophils Absolute: 0 10*3/uL (ref 0.0–0.1)
Basophils Relative: 0 %
Eosinophils Absolute: 0 10*3/uL (ref 0.0–0.5)
Eosinophils Relative: 0 %
HCT: 45.8 % (ref 36.0–46.0)
Hemoglobin: 15.4 g/dL — ABNORMAL HIGH (ref 12.0–15.0)
Immature Granulocytes: 0 %
Lymphocytes Relative: 8 %
Lymphs Abs: 1.4 10*3/uL (ref 0.7–4.0)
MCH: 27.1 pg (ref 26.0–34.0)
MCHC: 33.6 g/dL (ref 30.0–36.0)
MCV: 80.6 fL (ref 80.0–100.0)
Monocytes Absolute: 0.9 10*3/uL (ref 0.1–1.0)
Monocytes Relative: 5 %
Neutro Abs: 14.4 10*3/uL — ABNORMAL HIGH (ref 1.7–7.7)
Neutrophils Relative %: 87 %
Platelets: 315 10*3/uL (ref 150–400)
RBC: 5.68 MIL/uL — ABNORMAL HIGH (ref 3.87–5.11)
RDW: 13.7 % (ref 11.5–15.5)
WBC: 16.8 10*3/uL — ABNORMAL HIGH (ref 4.0–10.5)
nRBC: 0 % (ref 0.0–0.2)

## 2023-07-28 LAB — COMPREHENSIVE METABOLIC PANEL
ALT: 15 U/L (ref 0–44)
AST: 17 U/L (ref 15–41)
Albumin: 4.1 g/dL (ref 3.5–5.0)
Alkaline Phosphatase: 68 U/L (ref 38–126)
Anion gap: 12 (ref 5–15)
BUN: 9 mg/dL (ref 6–20)
CO2: 21 mmol/L — ABNORMAL LOW (ref 22–32)
Calcium: 9.4 mg/dL (ref 8.9–10.3)
Chloride: 104 mmol/L (ref 98–111)
Creatinine, Ser: 0.66 mg/dL (ref 0.44–1.00)
GFR, Estimated: >60 mL/min (ref >60)
Glucose, Bld: 108 mg/dL — ABNORMAL HIGH (ref 70–99)
Potassium: 3.5 mmol/L (ref 3.5–5.1)
Sodium: 137 mmol/L (ref 135–145)
Total Bilirubin: 1.6 mg/dL — ABNORMAL HIGH (ref <1.2)
Total Protein: 7.5 g/dL (ref 6.5–8.1)

## 2023-07-28 LAB — URINALYSIS, ROUTINE W REFLEX MICROSCOPIC
Glucose, UA: NEGATIVE mg/dL
Hgb urine dipstick: NEGATIVE
Ketones, ur: >=80 mg/dL — AB
Leukocytes,Ua: NEGATIVE
Nitrite: NEGATIVE
Protein, ur: 100 mg/dL — AB
Specific Gravity, Urine: >=1.03 (ref 1.005–1.030)
pH: 6 (ref 5.0–8.0)

## 2023-07-28 LAB — URINALYSIS, MICROSCOPIC (REFLEX): WBC, UA: NONE SEEN WBC/hpf (ref 0–5)

## 2023-07-28 LAB — TROPONIN I (HIGH SENSITIVITY)
Troponin I (High Sensitivity): 2 ng/L (ref <18)
Troponin I (High Sensitivity): 2 ng/L (ref <18)

## 2023-07-28 LAB — LIPASE, BLOOD: Lipase: 27 U/L (ref 11–51)

## 2023-07-28 MED ORDER — MORPHINE SULFATE (PF) 4 MG/ML IV SOLN
4.0000 mg | Freq: Once | INTRAVENOUS | Status: AC
  Administered 2023-07-28: 4 mg via INTRAVENOUS
  Filled 2023-07-28: qty 1

## 2023-07-28 MED ORDER — LACTATED RINGERS IV BOLUS
1000.0000 mL | Freq: Once | INTRAVENOUS | Status: AC
  Administered 2023-07-28: 1000 mL via INTRAVENOUS

## 2023-07-28 MED ORDER — ONDANSETRON HCL 4 MG PO TABS: 4.0000 mg | ORAL_TABLET | Freq: Three times a day (TID) | ORAL | 0 refills | Status: AC | PRN

## 2023-07-28 MED ORDER — IOHEXOL 300 MG/ML  SOLN: 100.0000 mL | Freq: Once | INTRAMUSCULAR | Status: DC | PRN

## 2023-07-28 MED ORDER — ONDANSETRON HCL 4 MG/2ML IJ SOLN
4.0000 mg | Freq: Once | INTRAMUSCULAR | Status: AC
  Administered 2023-07-28: 4 mg via INTRAVENOUS
  Filled 2023-07-28: qty 2

## 2023-07-28 MED ORDER — HYDROMORPHONE HCL 1 MG/ML IJ SOLN
0.5000 mg | Freq: Once | INTRAMUSCULAR | Status: AC
  Administered 2023-07-28: 0.5 mg via INTRAVENOUS
  Filled 2023-07-28: qty 1

## 2023-07-28 MED ORDER — AMOXICILLIN-POT CLAVULANATE 875-125 MG PO TABS
1.0000 | ORAL_TABLET | Freq: Once | ORAL | Status: AC
  Administered 2023-07-28: 1 via ORAL
  Filled 2023-07-28: qty 1

## 2023-07-28 MED ORDER — ACETAMINOPHEN 500 MG PO TABS
1000.0000 mg | ORAL_TABLET | Freq: Once | ORAL | Status: DC
  Filled 2023-07-28: qty 2

## 2023-07-28 MED ORDER — AMOXICILLIN-POT CLAVULANATE 875-125 MG PO TABS: 1.0000 | ORAL_TABLET | Freq: Two times a day (BID) | ORAL | 0 refills | Status: AC

## 2023-07-28 MED ORDER — IOHEXOL 350 MG/ML SOLN
100.0000 mL | Freq: Once | INTRAVENOUS | Status: AC | PRN
  Administered 2023-07-28: 100 mL via INTRAVENOUS

## 2023-07-28 MED ORDER — OXYCODONE-ACETAMINOPHEN 5-325 MG PO TABS: 1.0000 | ORAL_TABLET | Freq: Three times a day (TID) | ORAL | 0 refills | Status: DC | PRN

## 2023-07-28 NOTE — ED Triage Notes (Signed)
Pt reports she woke up today at 1 am severe lower abdominal cramping and diarrhea, + blood in her stools. She also reports chest pain and states her heart is racing.   She reports a 40lb weight loss by taking Wegovy for 6 months.

## 2023-07-28 NOTE — ED Provider Triage Note (Signed)
Emergency Medicine Provider Triage Evaluation Note  Kerri Patterson , a 47 y.o. female  was evaluated in triage.  Pt complains of abdominal cramping and pain, onset this morning around 1 am. Diarrhea with blood. Also complaining of chest discomfort. No significant contributory history. On weight loss regimen with weygovy  Review of Systems  Positive: Abdominal pain, chest discomfort, nausea, vomiting  Negative: Fever, chills  Physical Exam  LMP 11/12/2013  Gen:   Awake, appears uncomfortable   Resp:  Normal effort  MSK:   Moves extremities without difficulty  Other:  Abdomen soft, no focal tenderness  Medical Decision Making  Medically screening exam initiated at 5:56 PM.  Appropriate orders placed.  DAIJANAE RAFALSKI was informed that the remainder of the evaluation will be completed by another provider, this initial triage assessment does not replace that evaluation, and the importance of remaining in the ED until their evaluation is complete.     Felicie Morn, NP 07/28/23 1807

## 2023-07-28 NOTE — ED Notes (Signed)
Fall risk armband Fall risk socks Fall risk sign on door

## 2023-07-28 NOTE — ED Notes (Signed)
Patient transported to CT 

## 2023-07-28 NOTE — Discharge Instructions (Addendum)
I sent a prescription for Augmentin, take 1 tablet by mouth twice a day for 5 days.  I will send in a prescription for pain medication, please take the oxycodone for moderate to severe pain as needed every 8 hours.    Please take Zofran 4 mg as needed for nausea.    You can take Tylenol 325 to 650 mg every 4 to 6 hours as needed for mild to moderate pain, max dose 4000 mg per day.   Please continue hydration.   If your symptoms worsen please come back to the ED to get reevaluated.

## 2023-07-28 NOTE — ED Provider Notes (Signed)
[place] [facility] Provider Note   CSN: [id number] Arrival date & time: 07/28/23  1749     History  Chief Complaint  Patient presents with   Abdominal Pain    Kerri Patterson is a 47 y.o. female medical history of hypothyroidism, hypertension, on Wegovy for weight loss for the past 6 months, presents today for acute abdominal pain that started at 1 AM this morning.  Patient reports the abdominal pain woke up from her sleep at 1 AM, localized to her lower abdomen's, there is no radiation to the back or shoulder. She described as a muscular cramping that comes and goes has been worsening since this morning.  Patient associate symptoms of nausea, vomiting x6.  Patient reports initially her stools were watery, she started noticing bright red blood in her stools.  Patient endorses symptoms of fevers, chills, palpitation, chest tightness mostly in her right upper chest, denies any upper extremity weakness or numbness or tingling.  Patient reports shortness of breath with severe abdominal cramping.  Patient denied any recent use of NSAIDs, no recent alcohol use, no sick contacts, no recent travels, no recent surgery, no lower extremity swelling or tenderness.  Patient reports that she had a hysterectomy.  Patient reports that she had a colonoscopy this year in spring, they had removed polyps and told her to repeat colonoscopy in 5 years.     Home Medications Prior to Admission medications   Medication Sig Start Date End Date Taking? Authorizing Provider  amoxicillin-clavulanate (AUGMENTIN) 875-125 MG tablet Take 1 tablet by mouth 2 (two) times daily for 5 days. 07/28/23 08/02/23 Yes Binyamin Nelis, DO  ondansetron (ZOFRAN) 4 MG tablet Take 1 tablet (4 mg total) by mouth every 8 (eight) hours as needed for up to 4 days for nausea or vomiting. 07/28/23 08/01/23 Yes Breandan People, DO  oxyCODONE-acetaminophen (PERCOCET/ROXICET) 5-325 MG tablet Take 1 tablet by  mouth every 8 (eight) hours as needed for up to 3 days for severe pain (pain score 7-10) or moderate pain (pain score 4-6). 07/28/23 07/31/23 Yes Cyndee Giammarco, DO  albuterol (VENTOLIN HFA) 108 (90 Base) MCG/ACT inhaler Inhale into the lungs. 11/16/22 11/16/23  [provider]  benzonatate (TESSALON) 200 MG capsule Take 1 capsule (200 mg total) by mouth 2 (two) times daily as needed for cough. 11/23/22   Eustace Moore, MD  celecoxib (CELEBREX) 200 MG capsule One to 2 tablets by mouth daily as needed for pain. 06/04/23   Monica Becton, MD  dextromethorphan-guaiFENesin (MUCINEX DM) 30-600 MG 12hr tablet Take 1 tablet by mouth 2 (two) times daily. 11/23/22   Eustace Moore, MD  diclofenac Sodium (VOLTAREN) 1 % GEL Apply 4 g topically 4 (four) times daily. To affected joint. 04/24/23   Monica Becton, MD  ipratropium (ATROVENT) 0.03 % nasal spray Place 2 sprays into both nostrils every 12 (twelve) hours. 11/23/22   Eustace Moore, MD  Levothyroxine Sodium (SYNTHROID PO) Take by mouth.    [provider]  liothyronine (CYTOMEL) 5 MCG tablet Take 5 mcg by mouth daily. 02/27/22   [provider]  losartan (COZAAR) 25 MG tablet TAKE 1 TABLET BY MOUTH EVERY DAY IN THE MORNING    [provider]  Magnesium 200 MG TABS Take 2 tablets by mouth at bedtime. Magnesium Malate 1300 mg (200 mg magnesium)    [provider]  OVER THE COUNTER MEDICATION Take 1 tablet by mouth daily. Vitamin D3 5000 IU +  Vitamin K 1100 mcg + iodide 1000 mcg / tablet    [provider]      Allergies    Patient has no known allergies.    Review of Systems   Review of Systems  Gastrointestinal:  Positive for abdominal pain.    Physical Exam Updated Vital Signs BP 123/88   Pulse 93   Temp 99.4 F (37.4 C)   Resp 18   Ht 5\' 6"  (1.676 m)   Wt 90.7 kg   LMP 11/12/2013   SpO2 96%   BMI 32.28 kg/m  Physical Exam  General: Appears in pain, no acute  distress Cardiovascular: Tachycardic, no murmurs Pulmonary: Increased work of breathing, no wheezing or crackles Abdomen: Positive for generalized tenderness to palpation of the abdomen, + R flank tenderness  MSK: Range of motion intact, no tenderness to palpation of the bilateral lower extremity Neuro: Alert and oriented, no focal deficits  ED Results / Procedures / Treatments   Labs (all labs ordered are listed, but only abnormal results are displayed) Labs Reviewed  CBC WITH DIFFERENTIAL/PLATELET - Abnormal; Notable for the following components:      Result Value   WBC 16.8 (*)    RBC 5.68 (*)    Hemoglobin 15.4 (*)    Neutro Abs 14.4 (*)    All other components within normal limits  URINALYSIS, ROUTINE W REFLEX MICROSCOPIC - Abnormal; Notable for the following components:   Color, Urine AMBER (*)    Bilirubin Urine MODERATE (*)    Ketones, ur >=80 (*)    Protein, ur 100 (*)    All other components within normal limits  COMPREHENSIVE METABOLIC PANEL - Abnormal; Notable for the following components:   CO2 21 (*)    Glucose, Bld 108 (*)    Total Bilirubin 1.6 (*)    All other components within normal limits  URINALYSIS, MICROSCOPIC (REFLEX) - Abnormal; Notable for the following components:   Bacteria, UA RARE (*)    All other components within normal limits  LIPASE, BLOOD  TROPONIN I (HIGH SENSITIVITY)  TROPONIN I (HIGH SENSITIVITY)    EKG None  Radiology CT Angio Chest PE W and/or Wo Contrast  Result Date: 07/28/2023 CLINICAL DATA:  Chest pain EXAM: CT ANGIOGRAPHY CHEST WITH CONTRAST TECHNIQUE: Multidetector CT imaging of the chest was performed using the standard protocol during bolus administration of intravenous contrast. Multiplanar CT image reconstructions and MIPs were obtained to evaluate the vascular anatomy. RADIATION DOSE REDUCTION: This exam was performed according to the departmental dose-optimization program which includes automated exposure control,  adjustment of the mA and/or kV according to patient size and/or use of iterative reconstruction technique. CONTRAST:  OMNIPAQUE IOHEXOL 350 MG/ML SOLN, OMNIPAQUE IOHEXOL 300 MG/ML SOLN COMPARISON:  None Available. FINDINGS: Cardiovascular: Satisfactory opacification of the pulmonary arteries to the segmental level. No evidence of pulmonary embolism. Normal heart size. No pericardial effusion. Mediastinum/Nodes: No enlarged mediastinal, hilar, or axillary lymph nodes. Thyroid gland, trachea, and esophagus demonstrate no significant findings. Lungs/Pleura: Lungs are clear. No pleural effusion or pneumothorax. Upper Abdomen: No acute abnormality. Musculoskeletal: No chest wall abnormality. No acute or significant osseous findings. Review of the MIP images confirms the above findings. IMPRESSION: 1. No pulmonary embolism or other acute intrathoracic pathology identified. Electronically Signed   By: Helyn Numbers M.D.   On: 07/28/2023 21:31   CT ABDOMEN PELVIS W CONTRAST  Result Date: 07/28/2023 CLINICAL DATA:  Abdominal pain, acute, nonlocalized 47 y/o female. Patient complains of abdominal cramping,  bloody diarrhea and chest discomfort. EXAM: CT ABDOMEN AND PELVIS WITH CONTRAST TECHNIQUE: Multidetector CT imaging of the abdomen and pelvis was performed using the standard protocol following bolus administration of intravenous contrast. RADIATION DOSE REDUCTION: This exam was performed according to the departmental dose-optimization program which includes automated exposure control, adjustment of the mA and/or kV according to patient size and/or use of iterative reconstruction technique. CONTRAST:  OMNIPAQUE IOHEXOL 350 MG/ML SOLN, <See Chart> OMNIPAQUE IOHEXOL 300 MG/ML SOLN COMPARISON:  None Available. FINDINGS: Lower chest: No acute abnormality. Hepatobiliary: No focal liver abnormality. No gallstones, gallbladder wall thickening, or pericholecystic fluid. No biliary dilatation. Pancreas: No focal  lesion. Normal pancreatic contour. No surrounding inflammatory changes. No main pancreatic ductal dilatation. Spleen: Normal in size without focal abnormality. Adrenals/Urinary Tract: No adrenal nodule bilaterally. Bilateral kidneys enhance symmetrically. No hydronephrosis. No hydroureter. The urinary bladder is unremarkable. Stomach/Bowel: Stomach is within normal limits. No evidence of small bowel wall thickening or dilatation. Descending colon and proximal sigmoid colon bowel wall thickening and pericolonic fat stranding. The splenic flexure appears to be spared. Medialized mobile cecum. Appendix appears normal. Vascular/Lymphatic: No abdominal aorta or iliac aneurysm. Mild atherosclerotic plaque of the aorta and its branches. No abdominal, pelvic, or inguinal lymphadenopathy. Reproductive: Status post hysterectomy. No adnexal masses. Other: No intraperitoneal free fluid. No intraperitoneal free gas. No organized fluid collection. Musculoskeletal: No abdominal wall hernia or abnormality. No suspicious lytic or blastic osseous lesions. No acute displaced fracture. IMPRESSION: 1. Descending colitis and proximal sigmoiditis. 2.  Aortic Atherosclerosis (ICD10-I70.0). Electronically Signed   By: Tish Frederickson M.D.   On: 07/28/2023 21:21    Procedures Procedures    Medications Ordered in ED Medications  iohexol (OMNIPAQUE) 300 MG/ML solution 100 mL ( Intravenous Canceled Entry 07/28/23 1927)  acetaminophen (TYLENOL) tablet 1,000 mg (0 mg Oral Hold 07/28/23 2032)  lactated ringers bolus 1,000 mL (0 mLs Intravenous Stopped 07/28/23 2109)  ondansetron (ZOFRAN) injection 4 mg (4 mg Intravenous Given 07/28/23 1903)  morphine (PF) 4 MG/ML injection 4 mg (4 mg Intravenous Given 07/28/23 1903)  iohexol (OMNIPAQUE) 350 MG/ML injection 100 mL (100 mLs Intravenous Contrast Given 07/28/23 1909)  HYDROmorphone (DILAUDID) injection 0.5 mg (0.5 mg Intravenous Given 07/28/23 2029)  amoxicillin-clavulanate (AUGMENTIN) 875-125  MG per tablet 1 tablet (1 tablet Oral Given 07/28/23 2250)  HYDROmorphone (DILAUDID) injection 0.5 mg (0.5 mg Intravenous Given 07/28/23 2219)  ondansetron (ZOFRAN) injection 4 mg (4 mg Intravenous Given 07/28/23 2230)    ED Course/ Medical Decision Making/ A&P                           Geneva (Revised) Score: 5, Geneva Score Interpretation: Moderate Risk Group: ~20-30% incidence of pulmonary embolism from several studies PERC Score: 1, PERC Score Interpretation: If any criteria are positive, the PERC rule cannot be used to rule out PE in this patient   Medical Decision Making Amount and/or Complexity of Data Reviewed Radiology: ordered.  Risk OTC drugs. Prescription drug management.   This patient presents to the ED for concern of abdominal pain, this involves an extensive number of treatment options, and is a complaint that carries with it a high risk of complications and morbidity.  The differential diagnosis includes pancreatitis, cholecystitis, appendicitis, colitis, diverticulitis.    Co morbidities that complicate the patient evaluation  As per HPI   Additional history obtained:  Additional history obtained from chart review External records from outside source obtained and reviewed including  Care Everywhere   Lab Tests:  I Ordered, and personally interpreted labs.  The pertinent results include: No electrolyte abnormalities, creatinine normal, liver enzymes normal.  Leukocytosis with WBC of 16.8, hemoglobin 15.4, platelet normal.   Imaging Studies ordered:  I ordered imaging studies including CT abdomen pelvis with contrast, CT angio chest PE I independently visualized and interpreted imaging which showed negative PE, some inflammatory changes in the distal portion of the colon I agree with the radiologist interpretation  IMPRESSION: 1. Descending colitis and proximal sigmoiditis. 2.  Aortic Atherosclerosis (ICD10-I70.0).  IMPRESSION: 1. No pulmonary embolism or  other acute intrathoracic pathology identified.   Cardiac Monitoring: / EKG:  None  Consultations Obtained:  None  Problem List / ED Course / Critical interventions / Medication management  Colitis of the descending colon and proximal sigmoid colon, lower abdominal pain I ordered medication including Dilaudid 0.5 mg x2, morphine 4 mg x1, Zofran 4 mg x2, 1 L lactated Ringer's for abdominal pain and nausea  Reevaluation of the patient after these medicines showed that the patient improved I have reviewed the patients home medicines and have made adjustments as needed   Social Determinants of Health:  As above   Test / Admission - Considered:  89-year-old female past medical history hypothyroidism, hypertension, presents today for acute abdominal pain the started 1 AM this morning with associated symptoms of nausea, vomiting, lower abdominal pain.  Patient endorsed symptoms of fevers, chills, palpitations, chest tightness mostly in the right upper chest.  Physical exam remarkable for generalized abdominal tenderness, right flank tenderness, lower abdominal tenderness.  Labs showed leukocytosis with WBC of 16.8, otherwise no electrolyte normalities.  Troponin x 2 negative, unlikely ACS.  Lipase normal, unlikely pancreatitis.  CTA PE negative for PE.  CT abdomen pelvis showed descending colitis and proximal sigmoiditis.  Patient was given pain medications and nausea medications the ED here, her symptoms has improved upon reevaluation.  She was observed in the ED to see if she is able to tolerate p.o. medications.  Plan is to treat colitis with Augmentin twice daily for 5 days, and send patient home with nausea and patient medication. Patient understands the plan.   Final Clinical Impression(s) / ED Diagnoses Final diagnoses:  Colitis  Lower abdominal pain  Nausea and vomiting, unspecified vomiting type    Rx / DC Orders ED Discharge Orders          Ordered    amoxicillin-clavulanate  (AUGMENTIN) 875-125 MG tablet  2 times daily        07/28/23 2321    oxyCODONE-acetaminophen (PERCOCET/ROXICET) 5-325 MG tablet  Every 8 hours PRN        07/28/23 2321    ondansetron (ZOFRAN) 4 MG tablet  Every 8 hours PRN        07/28/23 2321              Jeral Pinch, DO 07/28/23 2323    Tegeler, Canary Brim, MD 07/29/23 2342

## 2023-07-29 ENCOUNTER — Telehealth (HOSPITAL_BASED_OUTPATIENT_CLINIC_OR_DEPARTMENT_OTHER): Payer: Self-pay | Admitting: Emergency Medicine

## 2023-07-29 MED ORDER — OXYCODONE-ACETAMINOPHEN 5-325 MG PO TABS: 1.0000 | ORAL_TABLET | Freq: Four times a day (QID) | ORAL | 0 refills | Status: DC | PRN

## 2023-07-29 NOTE — Telephone Encounter (Signed)
Narcotic prescribed by resident would not work due to NPI number according to [facility] pharmacist, represcribed.

## 2023-07-29 NOTE — Telephone Encounter (Signed)
Wrong pharmacy, redirected.

## 2023-07-30 ENCOUNTER — Encounter: Payer: Self-pay | Admitting: Sports Medicine

## 2024-05-24 ENCOUNTER — Encounter: Payer: Self-pay | Admitting: Sports Medicine

## 2024-07-30 ENCOUNTER — Ambulatory Visit
Admission: RE | Admit: 2024-07-30 | Discharge: 2024-07-30 | Disposition: A | Discharge status: Home / Self Care | Source: Ambulatory Visit | Attending: Family Medicine | Admitting: Family Medicine

## 2024-07-30 VITALS — BP 120/82 | HR 106 | Temp 101.1°F | Resp 18 | Ht 66.0 in | Wt 152.0 lb

## 2024-07-30 DIAGNOSIS — R6889 Other general symptoms and signs: Secondary | ICD-10-CM

## 2024-07-30 DIAGNOSIS — R509 Fever, unspecified: Secondary | ICD-10-CM | POA: Diagnosis not present

## 2024-07-30 LAB — POCT RAPID STREP A (OFFICE): Rapid Strep A Screen: NEGATIVE

## 2024-07-30 LAB — POC SOFIA SARS ANTIGEN FIA: SARS Coronavirus 2 Ag: NEGATIVE

## 2024-07-30 LAB — POCT INFLUENZA A/B
Influenza A, POC: NEGATIVE
Influenza B, POC: NEGATIVE

## 2024-07-30 MED ORDER — AMOXICILLIN-POT CLAVULANATE 875-125 MG PO TABS: 1.0000 | ORAL_TABLET | Freq: Two times a day (BID) | ORAL | 0 refills | Status: AC

## 2024-07-30 MED ORDER — FLUTICASONE PROPIONATE 50 MCG/ACT NA SUSP: 2.0000 | Freq: Every day | NASAL | 0 refills | Status: AC

## 2024-07-30 NOTE — Discharge Instructions (Signed)
 Make sure you are drinking lots of fluids May continue over-the-counter cough and cold medicines Add Flonase If you fail to see improvement over the next couple of days, or if your symptoms worsen you may add Augmentin  1 pill 2 times a day See your doctor if not improving by next week

## 2024-07-30 NOTE — ED Triage Notes (Signed)
 Patient c/o sore throat, bilateral ear pain, fever (lasted 12 hours) x 4 days.  Congestion, cough from drainage and chest pain.  Patient has taken Ibuprofen , Sudafed, Mucinex , Zyrtec, throat lozenges and nasal spray.

## 2024-07-30 NOTE — ED Notes (Signed)
 Patient was offered Tylenol  per standing orders, she declined.

## 2024-07-30 NOTE — ED Provider Notes (Signed)
 [facility]    CSN: [id number] Arrival date & time: 07/30/24  9074      History   Chief Complaint Chief Complaint  Patient presents with   Sore Throat    HPI Kerri Patterson is a 48 y.o. female.   HPI Patient has been sick for 4 days.  Arrives today with a fever still at 101.1.  She has sore throat, bilateral ear pain, congestion cough and postnasal drip.  Body aches and fatigue.  No recent travel.  No known exposure to illness.  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   S/P laparoscopic assisted vaginal hysterectomy (LAVH) 11/29/2013   Dysfunctional uterine bleeding 11/28/2013   Dysmenorrhea 11/28/2013    Past Surgical History:  Procedure Laterality Date   BILATERAL SALPINGECTOMY Bilateral 11/29/2013   Procedure: BILATERAL SALPINGECTOMY;  Surgeon: Ezzie Marshall, MD;  Location: [facility];  Service: Gynecology;  Laterality: Bilateral;   Invitro fertilization     LAPAROSCOPIC ASSISTED VAGINAL HYSTERECTOMY N/A 11/29/2013   Procedure: LAPAROSCOPIC ASSISTED VAGINAL HYSTERECTOMY;  Surgeon: Ezzie Marshall, MD;  Location: [facility];  Service: Gynecology;  Laterality: N/A;    OB History   No obstetric history on file.      Home Medications    Prior to Admission medications   Medication Sig Start Date End Date Taking? Authorizing Provider  amoxicillin -clavulanate (AUGMENTIN ) 875-125 MG tablet Take 1 tablet by mouth every 12 (twelve) hours. 07/30/24  Yes Maranda Jamee Jacob, MD  fluticasone (FLONASE) 50 MCG/ACT nasal spray  Place 2 sprays into both nostrils daily. 07/30/24  Yes Maranda Jamee Jacob, MD  Levothyroxine Sodium (SYNTHROID PO) Take by mouth.   Yes [provider]  liothyronine (CYTOMEL) 5 MCG tablet Take 5 mcg by mouth daily. 02/27/22  Yes [provider]  Magnesium 200 MG TABS Take 2 tablets by mouth at bedtime. Magnesium Malate 1300 mg (200 mg magnesium)   Yes [provider]  OVER THE COUNTER MEDICATION Take 1 tablet by mouth daily. Vitamin D3 5000 IU + Vitamin K 1100 mcg + iodide 1000 mcg / tablet   Yes [provider]  tirzepatide (ZEPBOUND) 10 MG/0.5ML Pen Inject 10 mg into the skin once a week.   Yes [provider]  diclofenac  Sodium (VOLTAREN ) 1 % GEL Apply 4 g topically 4 (four) times daily. To affected joint. 04/24/23   Curtis Debby PARAS, MD    Family History Family History  Problem Relation Age of Onset   Stroke Father     Social History Social History   Tobacco Use   Smoking status: Never   Smokeless tobacco: Never  Vaping Use   Vaping status: Never Used  Substance Use Topics   Alcohol use: Yes    Comment: very rare   Drug use: No     Allergies   Patient has no known allergies.   Review of Systems Review of Systems  See HPI Physical Exam Triage Vital Signs ED Triage Vitals  Encounter Vitals Group     BP 07/30/24 0944 120/82  Girls Systolic BP Percentile --      Girls Diastolic BP Percentile --      Boys Systolic BP Percentile --      Boys Diastolic BP Percentile --      Pulse Rate 07/30/24 0944 (!) 106     Resp 07/30/24 0944 18     Temp 07/30/24 0944 (!) 101.1 F (38.4 C)     Temp Source 07/30/24 0944 Oral     SpO2 07/30/24 0944 99 %     Weight 07/30/24 0941 152 lb (68.9 kg)     Height 07/30/24 0941 5' 6 (1.676 m)     Head Circumference --      Peak Flow --      Pain Score 07/30/24 0940 5     Pain Loc --      Pain Education --      Exclude from Growth Chart --    No data found.  Updated Vital Signs BP  120/82 (BP Location: Right Arm)   Pulse (!) 106   Temp (!) 101.1 F (38.4 C) (Oral)   Resp 18   Ht 5' 6 (1.676 m)   Wt 68.9 kg   LMP 11/12/2013   SpO2 99%   BMI 24.53 kg/m      Physical Exam Constitutional:      General: She is not in acute distress.    Appearance: She is well-developed. She is ill-appearing.  HENT:     Head: Normocephalic and atraumatic.     Right Ear: Tympanic membrane normal.     Left Ear: Tympanic membrane normal.     Nose: No congestion.     Mouth/Throat:     Mouth: Mucous membranes are moist.     Pharynx: No posterior oropharyngeal erythema.     Tonsils: 0 on the right. 0 on the left.  Eyes:     Conjunctiva/sclera: Conjunctivae normal.     Pupils: Pupils are equal, round, and reactive to light.  Cardiovascular:     Rate and Rhythm: Normal rate and regular rhythm.     Heart sounds: Normal heart sounds.  Pulmonary:     Effort: Pulmonary effort is normal. No respiratory distress.     Breath sounds: Rhonchi present.  Musculoskeletal:        General: Normal range of motion.     Cervical back: Normal range of motion.  Skin:    General: Skin is warm and dry.  Neurological:     Mental Status: She is alert.      UC Treatments / Results  Labs (all labs ordered are listed, but only abnormal results are displayed) Labs Reviewed  POC SOFIA SARS ANTIGEN FIA - Normal  POCT INFLUENZA A/B - Normal  POCT RAPID STREP A (OFFICE) - Normal    EKG   Radiology No results found.  Procedures Procedures (including critical care time)  Medications Ordered in UC Medications - No data to display  Initial Impression / Assessment and Plan / UC Course  I have reviewed the triage vital signs and the nursing notes.  Pertinent labs & imaging results that were available during my care of the patient were reviewed by me and considered in my medical decision making (see chart for details).     Discussed viral illness.  Likely needs more time to heal.  Not  flu COVID or strep. Discussed over-the-counter medicines to help with symptoms. Tylenol , ibuprofen , or Aleve to help reduce pain and fever Patient has to travel on Wednesday.  Concern she will be better by then.  She is given an antibiotic to take in case she fails to see improvement. Final Clinical Impressions(s) / UC Diagnoses   Final diagnoses:  Acute febrile illness  Flu-like symptoms     Discharge Instructions      Make sure you are drinking lots of fluids May continue over-the-counter cough and cold medicines Add Flonase If you fail to see improvement over the next couple of days, or if your symptoms worsen you may add Augmentin  1 pill 2 times a day See your doctor if not improving by next week     ED Prescriptions     Medication Sig Dispense Auth. Provider   fluticasone (FLONASE) 50 MCG/ACT nasal spray Place 2 sprays into both nostrils daily. 16 g Maranda Jamee Jacob, MD   amoxicillin -clavulanate (AUGMENTIN ) 875-125 MG tablet Take 1 tablet by mouth every 12 (twelve) hours. 14 tablet Maranda Jamee Jacob, MD      PDMP not reviewed this encounter.   Maranda Jamee Jacob, MD 07/30/24 [phone]
